# Patient Record
Sex: Female | Born: 1995 | Hispanic: No | Marital: Single | Smoking: Never smoker
Health system: Southern US, Community
[De-identification: ages and names within clinical notes are randomized; demographics above are authoritative.]

## PROBLEM LIST (undated history)

## (undated) HISTORY — PX: TONSILLECTOMY: SUR1361

---
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
## 2013-03-06 ENCOUNTER — Emergency Department (HOSPITAL_BASED_OUTPATIENT_CLINIC_OR_DEPARTMENT_OTHER)
Admission: EM | Admit: 2013-03-06 | Discharge: 2013-03-06 | Disposition: A | Discharge status: Home / Self Care | Payer: BC Managed Care – PPO | Attending: Emergency Medicine | Admitting: Emergency Medicine

## 2013-03-06 ENCOUNTER — Encounter (HOSPITAL_BASED_OUTPATIENT_CLINIC_OR_DEPARTMENT_OTHER): Payer: Self-pay | Admitting: Emergency Medicine

## 2013-03-06 DIAGNOSIS — N390 Urinary tract infection, site not specified: Secondary | ICD-10-CM | POA: Insufficient documentation

## 2013-03-06 DIAGNOSIS — Z3202 Encounter for pregnancy test, result negative: Secondary | ICD-10-CM | POA: Insufficient documentation

## 2013-03-06 DIAGNOSIS — Z79899 Other long term (current) drug therapy: Secondary | ICD-10-CM | POA: Insufficient documentation

## 2013-03-06 DIAGNOSIS — K2970 Gastritis, unspecified, without bleeding: Secondary | ICD-10-CM

## 2013-03-06 LAB — URINE MICROSCOPIC-ADD ON

## 2013-03-06 LAB — URINALYSIS, ROUTINE W REFLEX MICROSCOPIC
Bilirubin Urine: NEGATIVE
Glucose, UA: NEGATIVE mg/dL
Specific Gravity, Urine: 1.024 (ref 1.005–1.030)
Urobilinogen, UA: 1 mg/dL (ref 0.0–1.0)
pH: 6 (ref 5.0–8.0)

## 2013-03-06 LAB — PREGNANCY, URINE: Preg Test, Ur: NEGATIVE

## 2013-03-06 MED ORDER — GI COCKTAIL ~~LOC~~
30.0000 mL | Freq: Once | ORAL | Status: AC
  Administered 2013-03-06: 30 mL via ORAL
  Filled 2013-03-06: qty 30

## 2013-03-06 MED ORDER — OMEPRAZOLE 20 MG PO CPDR: 20.0000 mg | DELAYED_RELEASE_CAPSULE | Freq: Every day | ORAL | Status: DC

## 2013-03-06 MED ORDER — DICYCLOMINE HCL 10 MG/ML IM SOLN
20.0000 mg | Freq: Once | INTRAMUSCULAR | Status: AC
  Administered 2013-03-06: 20 mg via INTRAMUSCULAR
  Filled 2013-03-06: qty 2

## 2013-03-06 MED ORDER — SUCRALFATE 1 GM/10ML PO SUSP: 1.0000 g | Freq: Four times a day (QID) | ORAL | Status: DC

## 2013-03-06 MED ORDER — NITROFURANTOIN MONOHYD MACRO 100 MG PO CAPS: 100.0000 mg | ORAL_CAPSULE | Freq: Two times a day (BID) | ORAL | Status: DC

## 2013-03-06 NOTE — ED Provider Notes (Signed)
CSN: [id number]     Arrival date & time 03/06/13  0600 History   First MD Initiated Contact with Patient 03/06/13 [phone]     Chief Complaint  Patient presents with  . Abdominal Pain   (Consider location/radiation/quality/duration/timing/severity/associated sxs/prior Treatment) Patient is a 17 y.o. female presenting with abdominal pain. The history is provided by the patient.  Abdominal Pain Pain location:  LUQ and epigastric Pain quality: cramping   Pain radiates to:  Does not radiate Pain severity:  Moderate Onset quality:  Sudden Duration:  2 hours Timing:  Constant Progression:  Unchanged Chronicity:  Recurrent Context: eating   Relieved by:  Nothing Worsened by:  Nothing tried Ineffective treatments:  None tried Associated symptoms: no anorexia, no chest pain, no dysuria, no nausea and no shortness of breath   Risk factors: has not had multiple surgeries     History reviewed. No pertinent past medical history. Past Surgical History  Procedure Laterality Date  . Tonsillectomy     History reviewed. No pertinent family history. History  Substance Use Topics  . Smoking status: Never Smoker   . Smokeless tobacco: Not on file  . Alcohol Use: No   OB History   Grav Para Term Preterm Abortions TAB SAB Ect Mult Living                 Review of Systems  Respiratory: Negative for shortness of breath.   Cardiovascular: Negative for chest pain.  Gastrointestinal: Positive for abdominal pain. Negative for nausea and anorexia.  Genitourinary: Negative for dysuria.  All other systems reviewed and are negative.    Allergies  Review of patient's allergies indicates no known allergies.  Home Medications   Current Outpatient Rx  Name  Route  Sig  Dispense  Refill  . omeprazole (PRILOSEC) 20 MG capsule   Oral   Take 1 capsule (20 mg total) by mouth daily.   30 capsule   0   . ondansetron (ZOFRAN ODT) 8 MG disintegrating tablet      8mg  ODT q8 hours prn nausea   4  tablet   0    BP 116/75  Pulse 75  Temp(Src) 98.4 F (36.9 C) (Oral)  Resp 16  Ht 5\' 2"  (1.575 m)  Wt 134 lb (60.782 kg)  BMI 24.5 kg/m2  SpO2 100%  LMP 03/04/2013 Physical Exam  Constitutional: She is oriented to person, place, and time. She appears well-developed and well-nourished. No distress.  Patient sleeping upon entrance to the room  HENT:  Head: Normocephalic and atraumatic.  Mouth/Throat: Oropharynx is clear and moist.  Eyes: Conjunctivae are normal. Pupils are equal, round, and reactive to light.  Neck: Normal range of motion. Neck supple.  Cardiovascular: Normal rate, regular rhythm and intact distal pulses.   Pulmonary/Chest: Effort normal and breath sounds normal. She has no wheezes. She has no rales.  Abdominal: Soft. Bowel sounds are increased. There is no tenderness. There is no rebound and no guarding.  Musculoskeletal: Normal range of motion.  Neurological: She is alert and oriented to person, place, and time.  Skin: Skin is warm and dry.  Psychiatric: She has a normal mood and affect.    ED Course  Procedures (including critical care time) Labs Review Labs Reviewed  URINALYSIS, ROUTINE W REFLEX MICROSCOPIC  PREGNANCY, URINE   Imaging Review No results found.  EKG Interpretation   None       MDM  No diagnosis found. Exam and vitals reassuring consistent with gastritis likely from  nachos.  Will treat.  No indication for labs or imaging at this time  Mallie Linnemann Smitty Cords, MD 03/06/13 [phone]

## 2013-03-06 NOTE — ED Notes (Signed)
C/o upper abd pain that woke her from sleep, pt denies n/v. No fever or diarrhea.

## 2013-03-06 NOTE — ED Notes (Signed)
MD at bedside. 

## 2018-09-27 ENCOUNTER — Other Ambulatory Visit: Payer: Self-pay

## 2018-09-27 ENCOUNTER — Encounter (HOSPITAL_BASED_OUTPATIENT_CLINIC_OR_DEPARTMENT_OTHER): Payer: Self-pay | Admitting: *Deleted

## 2018-09-27 ENCOUNTER — Emergency Department (HOSPITAL_BASED_OUTPATIENT_CLINIC_OR_DEPARTMENT_OTHER)
Admission: EM | Admit: 2018-09-27 | Discharge: 2018-09-27 | Disposition: A | Discharge status: Home / Self Care | Payer: BLUE CROSS/BLUE SHIELD | Attending: Emergency Medicine | Admitting: Emergency Medicine

## 2018-09-27 DIAGNOSIS — Y990 Civilian activity done for income or pay: Secondary | ICD-10-CM | POA: Diagnosis not present

## 2018-09-27 DIAGNOSIS — W228XXA Striking against or struck by other objects, initial encounter: Secondary | ICD-10-CM | POA: Insufficient documentation

## 2018-09-27 DIAGNOSIS — S060X0A Concussion without loss of consciousness, initial encounter: Secondary | ICD-10-CM | POA: Diagnosis not present

## 2018-09-27 DIAGNOSIS — Y9259 Other trade areas as the place of occurrence of the external cause: Secondary | ICD-10-CM | POA: Insufficient documentation

## 2018-09-27 DIAGNOSIS — Y9301 Activity, walking, marching and hiking: Secondary | ICD-10-CM | POA: Diagnosis not present

## 2018-09-27 DIAGNOSIS — S0990XA Unspecified injury of head, initial encounter: Secondary | ICD-10-CM | POA: Diagnosis present

## 2018-09-27 MED ORDER — ONDANSETRON 4 MG PO TBDP: 4.0000 mg | ORAL_TABLET | Freq: Three times a day (TID) | ORAL | 0 refills | Status: DC | PRN

## 2018-09-27 NOTE — ED Triage Notes (Signed)
Pt c/o head injury x 1 day ago c/o h/a

## 2018-09-27 NOTE — ED Notes (Signed)
ED Provider at bedside. 

## 2018-09-27 NOTE — ED Provider Notes (Addendum)
[facility] EMERGENCY DEPARTMENT Provider Note   CSN: [id number] Arrival date & time: 09/27/18  1640    History   Chief Complaint Chief Complaint  Patient presents with  . Head Injury    HPI Heidi Miles is a 23 y.o. female is here for evaluation of head injury while at work last night.  She works at [employer].  She was walking under a metal lift when she unfortunately ran into the bottom part of it.  She had sudden frontal head pain and fell to the ground but did not LOC.  This event was witnessed.  She remembers feeling a little dazed for 2 to 3 minutes afterwards.  She was able to finish the rest of his shift and drove home.  On her drive that night she had to pull over once and had one episode of emesis.  The bright lights were bothering her eyes.  Had associated intermittent blurred vision yesterday, but none today.  Other associated symptoms include nausea, photophobia and mild frontal/forehead scalp pain since the injury.  Has not taken anything for her symptoms.  She denies neck pain, numbness or weakness unilaterally, persistent vomiting, anticoagulant use, seizure.  No recent head injuries.  No history of concussions.     HPI  History reviewed. No pertinent past medical history.  Patient Active Problem List   Diagnosis Date Noted  . WRIST PAIN, LEFT 02/05/2010    Past Surgical History:  Procedure Laterality Date  . TONSILLECTOMY       OB History   No obstetric history on file.      Home Medications    Prior to Admission medications   Medication Sig Start Date End Date Taking? Authorizing Provider  omeprazole (PRILOSEC) 20 MG capsule Take 1 capsule (20 mg total) by mouth daily. 12/22/12   Palumbo, April, MD  omeprazole (PRILOSEC) 20 MG capsule Take 1 capsule (20 mg total) by mouth daily. 03/06/13   Palumbo, April, MD  ondansetron (ZOFRAN ODT) 4 MG disintegrating tablet Take 1 tablet (4 mg total) by mouth every 8 (eight) hours as needed for nausea or  vomiting. 09/27/18   Liberty Handy, PA-C  sucralfate (CARAFATE) 1 GM/10ML suspension Take 10 mLs (1 g total) by mouth 4 (four) times daily. 03/06/13   Palumbo, April, MD    Family History No family history on file.  Social History Social History   Tobacco Use  . Smoking status: Never Smoker  Substance Use Topics  . Alcohol use: No  . Drug use: Yes    Types: Marijuana     Allergies   Patient has no known allergies.   Review of Systems Review of Systems  Eyes: Positive for photophobia and visual disturbance.  Gastrointestinal: Positive for vomiting (x1).  Neurological: Positive for headaches.  All other systems reviewed and are negative.    Physical Exam Updated Vital Signs BP (!) 146/89 (BP Location: Right Arm)   Pulse 75   Temp 98.4 F (36.9 C) (Oral)   Resp 18   Ht 5\' 6"  (1.676 m)   Wt 59 kg   LMP 09/26/2018   SpO2 100%   BMI 20.98 kg/m   Physical Exam Constitutional:      Appearance: She is well-developed. She is not toxic-appearing.  HENT:     Head: Normocephalic.      Comments: Mild tenderness to upper central forehead by hairline, no obvious edema. No overlaying skin abrasions, lacerations, ecchymosis.  Scalp is normal.     Right  Ear: External ear normal.     Left Ear: External ear normal.     Ears:     Comments: No hemotympanum bilaterally. No Battle's sign    Nose: Nose normal.  Eyes:     Conjunctiva/sclera: Conjunctivae normal.     Comments: No racoon's eyes. Able to read my badge at arm's length with each eye and both eyes, no blurred vision.    Neck:     Musculoskeletal: Full passive range of motion without pain.     Comments: c-spine: no midline or paraspinal muscle tenderness  Cardiovascular:     Rate and Rhythm: Normal rate.  Pulmonary:     Effort: Pulmonary effort is normal. No tachypnea or respiratory distress.  Musculoskeletal: Normal range of motion.  Skin:    General: Skin is warm and dry.     Capillary Refill: Capillary  refill takes less than 2 seconds.  Neurological:     Mental Status: She is alert and oriented to person, place, and time.     Comments: Alert and oriented to self, place, time and event.  Speech is fluent without dysarthria or dysphasia. Strength 5/5 in upper/lower extremities intact.   Sensation to light touch intact in face, hands and feet. Normal gait No pronator drift. No leg drop. Normal finger-to-nose.  CN I not tested CN II grossly intact visual fields bilaterally. Unable to visualize posterior eye. CN III, IV, VI PEERL and EOMs intact bilaterally CN V light touch intact in all 3 divisions of trigeminal nerve CN VII facial movements symmetric CN VIII not tested CN IX, X no uvula deviation, symmetric rise of soft palate  CN XI 5/5 SCM and trapezius strength bilaterally  CN XII Midline tongue protrusion, symmetric L/R movements  Psychiatric:        Behavior: Behavior normal.        Thought Content: Thought content normal.      ED Treatments / Results  Labs (all labs ordered are listed, but only abnormal results are displayed) Labs Reviewed - No data to display  EKG None  Radiology No results found.  Procedures Procedures (including critical care time)  Medications Ordered in ED Medications - No data to display   Initial Impression / Assessment and Plan / ED Course  I have reviewed the triage vital signs and the nursing notes.  Pertinent labs & imaging results that were available during my care of the patient were reviewed by me and considered in my medical decision making (see chart for details).       Patient with focal, mild HA, nausea, emesis x 1, photophobia after head trauma.  No LOC.  Low risk MOI.  No other red flag symptoms of head trauma such as diminished GCS, amnesia to event, severe HA, LOC, multiple episodes of vomiting, seizure, evidence to suggest skull fracture, focal neurological deficits on physical exam.  No parietal, occipital, temporal  scalp hematoma. Normal mental status.  No anticoagulant use.  Given benign symptoms, reassuring neurological exam, I do not think CT scan is indicated at this time.  Presentation consistent with mild concussion/TBI vs traumatic headache. Concussion/mild TBI anticipatory guidance discussed with and given to patient.  Also discussed post concussive syndrome, symptoms that would warrant return to the emergency department for re-evaluation.  Information pertaining to diagnosis given to patient.   Recommended 48-72 hr cognitive and physical rest period, and slow return to work if symptoms have resolved.  F/u with PCP/concussion clinic in 48 hours for re-evaluation if symptoms  are lingering.  Patient verbalized understanding and comfortable with plan.   Final Clinical Impressions(s) / ED Diagnoses   Final diagnoses:  Concussion without loss of consciousness, initial encounter    ED Discharge Orders         Ordered    ondansetron (ZOFRAN ODT) 4 MG disintegrating tablet  Every 8 hours PRN     09/27/18 1732             Liberty HandyGibbons, Lacharles Altschuler J, PA-C 09/27/18 1756    Arby BarrettePfeiffer, Marcy, MD 10/11/18 [phone]

## 2018-09-27 NOTE — Discharge Instructions (Addendum)
Your symptoms are most likely due to a mild concussion. Read attached information on concussion.   Concussion symptoms include include headache, nausea, sleep disturbance, dizziness, short term memory loss, vision disturbances, nausea, vomiting, light sensitivity.  Symptom may persist and slowly improve in 2-3 weeks, usually they resolve sooner in less than 1 week. These symptoms can wax and wane, and can be worsened by physical or cognitive exertion like reading, watching TV, studying, using computer, playing video games.   Take acetaminophen or ibuprofen for associated pain. Ice your scalp. Ondansetron 4 mg for nausea, as needed.    We recommend physical and cognitive rest for the next 48-72 hours. This means avoid any heavy exertional or cognitive (brain) activity during this time, that may worsen your symptoms, including reading, computer use, playing video games, watching TV, exercising, jumping, playing sports, high stress work environments.  You may return to work slowly after 48-72 hour rest period if your symptoms have resolved.  If your symptoms have not resolved after this 48-72 hour rest period, you will need to follow up with your primary care doctor or concussion or sports specialist for re-evaluation and to determine if you can return to work or if you need more tests or longer rest period.  The most important part of concussion management is avoiding a repeat head injury, do not perform any activities that will put you at risk for a second head injury. If you are an athlete, you must be cleared by a clinician before fully returning to sports.   Return to the emergency department if you develop mental status change, lethargy, vomiting, vision changes, confusion, severe or worsening headaches, seizures, weakness or numbness involving any part of your body.

## 2018-09-28 ENCOUNTER — Emergency Department (HOSPITAL_COMMUNITY)
Admission: EM | Admit: 2018-09-28 | Discharge: 2018-09-28 | Disposition: A | Discharge status: Home / Self Care | Payer: No Typology Code available for payment source | Attending: Emergency Medicine | Admitting: Emergency Medicine

## 2018-09-28 ENCOUNTER — Other Ambulatory Visit: Payer: Self-pay

## 2018-09-28 ENCOUNTER — Encounter (HOSPITAL_COMMUNITY): Payer: Self-pay | Admitting: Emergency Medicine

## 2018-09-28 ENCOUNTER — Emergency Department (HOSPITAL_COMMUNITY): Payer: No Typology Code available for payment source

## 2018-09-28 DIAGNOSIS — W228XXD Striking against or struck by other objects, subsequent encounter: Secondary | ICD-10-CM | POA: Insufficient documentation

## 2018-09-28 DIAGNOSIS — R42 Dizziness and giddiness: Secondary | ICD-10-CM | POA: Diagnosis not present

## 2018-09-28 DIAGNOSIS — R1110 Vomiting, unspecified: Secondary | ICD-10-CM | POA: Insufficient documentation

## 2018-09-28 DIAGNOSIS — F1210 Cannabis abuse, uncomplicated: Secondary | ICD-10-CM | POA: Insufficient documentation

## 2018-09-28 DIAGNOSIS — R51 Headache: Secondary | ICD-10-CM | POA: Diagnosis not present

## 2018-09-28 DIAGNOSIS — S0990XD Unspecified injury of head, subsequent encounter: Secondary | ICD-10-CM | POA: Insufficient documentation

## 2018-09-28 DIAGNOSIS — M542 Cervicalgia: Secondary | ICD-10-CM | POA: Diagnosis not present

## 2018-09-28 MED ORDER — ONDANSETRON 4 MG PO TBDP
4.0000 mg | ORAL_TABLET | Freq: Once | ORAL | Status: AC
  Administered 2018-09-28: 4 mg via ORAL
  Filled 2018-09-28: qty 1

## 2018-09-28 MED ORDER — ACETAMINOPHEN 325 MG PO TABS
650.0000 mg | ORAL_TABLET | Freq: Once | ORAL | Status: AC
  Administered 2018-09-28: 11:00:00 650 mg via ORAL
  Filled 2018-09-28: qty 2

## 2018-09-28 NOTE — Discharge Instructions (Signed)
You were seen in the emergency department today following a head injury.  We suspect that you have a concussion, otherwise known and as a mild traumatic brain injury.  Your CT scan did not show any new abnormality such as a brain bleed.  We would like you to follow-up with the [facility], contact information below:  Address: [address] [place], [place] [address] Phone: [phone]  Per [facility] Website:   What to Expect: Evaluations at the Concussion Clinic All patients at the Concussion Clinic are given an extensive three-part evaluation that includes: a computerized test to measure memory, visual processing speed, and reaction time a test that measures the systems that integrate movement, balance, and vision an in-depth review of a detailed symptoms checklist for signs of concussion The evaluation process is critical for the treatment and recovery of a concussed patient as no two concussions are alike. Thankfully, the diagnostic tools that trained professionals use can help to better manage head injuries.  Part of the technology the doctors and staff at [facility] use in their assessments is a computerized examination called ImPACT. This tool uses six tasks to measure memory, visual processing speed, and reaction time. By analyzing the results of the examination and comparing them to average responses or a baseline score for a patient, our staff can make an informed judgment about the patients cognitive functions. In addition to the ImPACT examination, patients are given a Vestibular Ocular Motor Screening test. This is a simple and painless test that focuses on the systems that integrate a patients movement, balance, and vision.  These tests are used in conjunction with a thorough review of a detailed symptoms checklist to complete the patients evaluation and develop a treatment plan.  You do not need a referral,  and you can book an appointment online. Our Concussion Hotline is staffed by trained professionals during our regular office hours: Monday - Thursday from 7:30 AM to 4:30 PM, and Fridays from 7:30 AM to 12:00 PM, and the number to call is [phone]. The Concussion Clinic team meets with patients at our [facility] office. Call today.   Further ED Instructions:   Please call and follow-up within the concussion clinic as well as your primary care provider within the next 3 to 5 days.  In the meantime we would like you to avoid strenuous/over exertional activities such as sports or running.  Please avoid excess screen time utilizing cell phones, computers, or the TV.  Please avoid activities that require significant amount of concentration.  Please try to rest as much as possible.  Please take Tylenol and/or Motrin per over-the-counter dosing instructions for any continued discomfort.  Take zofran as prescribed at prior visit for nausea/vomiting. Return to the ER for new or worsening symptoms or any other concerns that you may have.

## 2018-09-28 NOTE — ED Notes (Signed)
Patient verbalizes understanding of discharge instructions. Opportunity for questioning and answers were provided. Armband removed by staff, pt discharged from ED.  

## 2018-09-28 NOTE — ED Provider Notes (Signed)
[facility] Provider Note   CSN: [id number] Arrival date & time: 09/28/18  1048    History   Chief Complaint Chief Complaint  Patient presents with  . Head Injury  . Emesis    HPI Heidi Miles is a 23 y.o. female returns to the ER from employee health for repeat evaluation s/p head injury 2 days prior. Patient works at [employer], she was crouching/walking under a metal lift when she ran into the bottom portion of it with resultant fairly quick onset frontal headache. She did fall to the ground, but does not believe she had any LOC. She states she does not fully remember as she felt dazed. Ambulatory following. Had an episodes of emesis on her drive home. Seen in ER next day, thought to have concussion/mild TBI, discharged home w/ zofran. States she has had continued headache w/ some neck pain, feels a bit dizzy with position changes, and this AM woke up with 2 episodes of emesis PTA.  Did not take any of the Zofran previously prescribed as she wasn't sure if she could take it on an empty stomach. Current pain 5/10 in severity, tylenol has helped some, a bit worse with bright lights.  Seen at employee health and was instructed to come to the emergency department for CT imaging of her head (despite triage note mention of MRI- patient states CT to me).  He had some blurry vision initially but this is resolved.  Denies numbness, tingling, weakness, or seizure activity.  Patient is currently menstruating, denies chance of pregnancy.     HPI  History reviewed. No pertinent past medical history.  Patient Active Problem List   Diagnosis Date Noted  . WRIST PAIN, LEFT 02/05/2010    Past Surgical History:  Procedure Laterality Date  . TONSILLECTOMY       OB History   No obstetric history on file.      Home Medications    Prior to Admission medications   Medication Sig Start Date End Date Taking? Authorizing Provider  omeprazole (PRILOSEC) 20 MG  capsule Take 1 capsule (20 mg total) by mouth daily. 12/22/12   Palumbo, April, MD  omeprazole (PRILOSEC) 20 MG capsule Take 1 capsule (20 mg total) by mouth daily. 03/06/13   Palumbo, April, MD  ondansetron (ZOFRAN ODT) 4 MG disintegrating tablet Take 1 tablet (4 mg total) by mouth every 8 (eight) hours as needed for nausea or vomiting. 09/27/18   Liberty HandyGibbons, Claudia J, PA-C  sucralfate (CARAFATE) 1 GM/10ML suspension Take 10 mLs (1 g total) by mouth 4 (four) times daily. 03/06/13   Palumbo, April, MD    Family History No family history on file.  Social History Social History   Tobacco Use  . Smoking status: Never Smoker  . Smokeless tobacco: Never Used  Substance Use Topics  . Alcohol use: No  . Drug use: Yes    Types: Marijuana     Allergies   Patient has no known allergies.   Review of Systems Review of Systems  Constitutional: Negative for chills and fever.  Eyes: Positive for visual disturbance (resolved).  Respiratory: Negative for shortness of breath.   Cardiovascular: Negative for chest pain.  Gastrointestinal: Positive for nausea and vomiting. Negative for abdominal pain, blood in stool, constipation and diarrhea.  Genitourinary: Positive for vaginal bleeding (currently menstruating). Negative for dysuria.  Musculoskeletal: Positive for neck pain.  Neurological: Positive for dizziness and headaches. Negative for seizures, speech difficulty, weakness and numbness.  All  other systems reviewed and are negative.    Physical Exam Updated Vital Signs BP 130/85 (BP Location: Right Arm)   Pulse 68   Temp 98 F (36.7 C) (Oral)   Resp 16   Wt 59 kg   LMP 09/26/2018   SpO2 100%   BMI 20.99 kg/m   Physical Exam Vitals signs and nursing note reviewed.  Constitutional:      General: She is not in acute distress.    Appearance: She is well-developed.  HENT:     Head: Normocephalic and atraumatic. No raccoon eyes or Battle's sign.     Right Ear: No hemotympanum.      Left Ear: No hemotympanum.  Eyes:     General:        Right eye: No discharge.        Left eye: No discharge.     Conjunctiva/sclera: Conjunctivae normal.     Pupils: Pupils are equal, round, and reactive to light.  Neck:     Musculoskeletal: Normal range of motion. Spinous process tenderness (diffuse midline cervical spine without point/focal tenderness) and muscular tenderness present.  Cardiovascular:     Rate and Rhythm: Normal rate and regular rhythm.     Heart sounds: No murmur.  Pulmonary:     Effort: No respiratory distress.     Breath sounds: Normal breath sounds. No wheezing or rales.  Abdominal:     General: There is no distension.     Palpations: Abdomen is soft.     Tenderness: There is no abdominal tenderness. There is no guarding or rebound.  Skin:    General: Skin is warm and dry.     Findings: No rash.  Neurological:     Comments: Alert. Clear speech. No facial droop. CNIII-XII grossly intact. Bilateral upper and lower extremities' sensation grossly intact. 5/5 symmetric strength with grip strength and with plantar and dorsi flexion bilaterally. Normal finger to nose bilaterally. Negative pronator drift. Negative Romberg sign. Gait is steady and intact.   Psychiatric:        Behavior: Behavior normal.      ED Treatments / Results  Labs (all labs ordered are listed, but only abnormal results are displayed) Labs Reviewed - No data to display  EKG None  Radiology Ct Head Wo Contrast  Result Date: 09/28/2018 CLINICAL DATA:  Ran into a metal pole at work 2 days ago. Persistent nausea and vomiting since the injury with frontal headache and posterior neck pain. EXAM: CT HEAD WITHOUT CONTRAST CT CERVICAL SPINE WITHOUT CONTRAST TECHNIQUE: Multidetector CT imaging of the head and cervical spine was performed following the standard protocol without intravenous contrast. Multiplanar CT image reconstructions of the cervical spine were also generated. COMPARISON:  None.  FINDINGS: CT HEAD FINDINGS Brain: No evidence of acute infarction, hemorrhage, hydrocephalus, extra-axial collection or mass lesion/mass effect. Vascular: No hyperdense vessel or unexpected calcification. Skull: Normal. Negative for fracture or focal lesion. Sinuses/Orbits: No acute finding. Other: None. CT CERVICAL SPINE FINDINGS Alignment: Reversal of the normal cervical lordosis. Otherwise normal alignment. Skull base and vertebrae: No acute fracture. No primary bone lesion or focal pathologic process. Soft tissues and spinal canal: No prevertebral fluid or swelling. No visible canal hematoma. Disc levels:  Normal. Upper chest: Negative. Other: None. IMPRESSION: 1.  No acute intracranial abnormality. 2.  No acute cervical spine fracture. Electronically Signed   By: Obie Dredge M.D.   On: 09/28/2018 12:26   Ct Cervical Spine Wo Contrast  Result Date: 09/28/2018 CLINICAL  DATA:  Ran into a metal pole at work 2 days ago. Persistent nausea and vomiting since the injury with frontal headache and posterior neck pain. EXAM: CT HEAD WITHOUT CONTRAST CT CERVICAL SPINE WITHOUT CONTRAST TECHNIQUE: Multidetector CT imaging of the head and cervical spine was performed following the standard protocol without intravenous contrast. Multiplanar CT image reconstructions of the cervical spine were also generated. COMPARISON:  None. FINDINGS: CT HEAD FINDINGS Brain: No evidence of acute infarction, hemorrhage, hydrocephalus, extra-axial collection or mass lesion/mass effect. Vascular: No hyperdense vessel or unexpected calcification. Skull: Normal. Negative for fracture or focal lesion. Sinuses/Orbits: No acute finding. Other: None. CT CERVICAL SPINE FINDINGS Alignment: Reversal of the normal cervical lordosis. Otherwise normal alignment. Skull base and vertebrae: No acute fracture. No primary bone lesion or focal pathologic process. Soft tissues and spinal canal: No prevertebral fluid or swelling. No visible canal hematoma.  Disc levels:  Normal. Upper chest: Negative. Other: None. IMPRESSION: 1.  No acute intracranial abnormality. 2.  No acute cervical spine fracture. Electronically Signed   By: Obie Dredge M.D.   On: 09/28/2018 12:26    Procedures Procedures (including critical care time)  Medications Ordered in ED Medications - No data to display   Initial Impression / Assessment and Plan / ED Course  I have reviewed the triage vital signs and the nursing notes.  Pertinent labs & imaging results that were available during my care of the patient were reviewed by me and considered in my medical decision making (see chart for details).   Patient presents to the emergency department status post head injury 2 days prior with continued headache, intermittent dizziness, as well as nausea with 3 episodes of emesis since the injury.  Patient nontoxic-appearing, no apparent distress, vitals WNL.  She was sent to the emergency department from employee health for imaging of her head.  She has a fairly benign physical exam.  She has some mild diffuse cervical tenderness without point/focal vertebral tenderness or palpable step-off.  Neurologic exam without focal deficits. Abdomen is nontender without peritoneal signs- do not suspect acute surgical abdomen, vomiting likely secondary to head injury. Initial plan for urine pregnancy testing, patient unable to provide sample, on her menstrual cycle and denies chance of pregnancy therefore this was discontinued.  CT head/Cspine negative for acute abnormality. No head bleed. Likely concussion/mild TBI as discussed @ prior ER visit. Tolerating PO here without vomiting. Discharge home with concussion clinic follow up. Has prior prescription for zofran. Tylenol/motrin recommended for pain. I discussed results, treatment plan, need for follow-up, and return precautions with the patient. Provided opportunity for questions, patient confirmed understanding and is in agreement with plan.    Final Clinical Impressions(s) / ED Diagnoses   Final diagnoses:  Injury of head, subsequent encounter    ED Discharge Orders    None       Cherly Anderson, PA-C 09/28/18 1252    Margarita Grizzle, MD 09/28/18 [phone]

## 2018-09-28 NOTE — ED Triage Notes (Signed)
Pt in with c/o mild concussion sustained 48hrs ago at work. Has had persistent n/v since. Workers comp sent her to ED for MRI and eval

## 2019-12-15 IMAGING — CT CT CERVICAL SPINE WITHOUT CONTRAST
3 of 4 series · 13 of 33 positions shown, 16 images · non-contrast
Comparison: None.

CLINICAL DATA: Ran into a metal pole at work 2 days ago. Persistent
nausea and vomiting since the injury with frontal headache and
posterior neck pain.

EXAM:
CT HEAD WITHOUT CONTRAST
CT CERVICAL SPINE WITHOUT CONTRAST
TECHNIQUE: Multidetector CT imaging of the head and cervical spine was
performed following the standard protocol without intravenous
contrast. Multiplanar CT image reconstructions of the cervical spine
were also generated.

[Series 5: c_spine 2.0 st · axial · 0.28mm/px · z∈[-176,-44]mm · 5 of 100 slices shown, 7 images]
[im 17/100  soft-tissue]
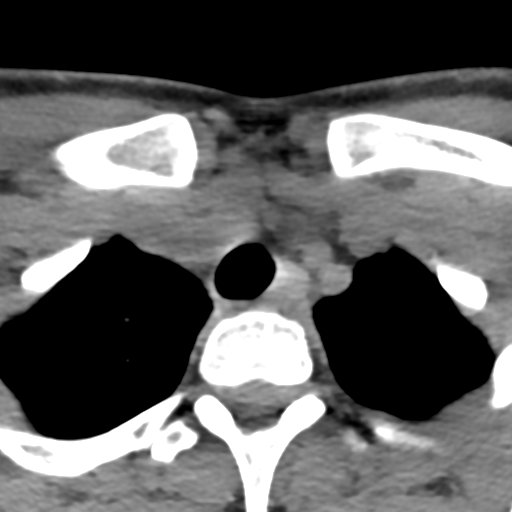
[im 17/100  bone]
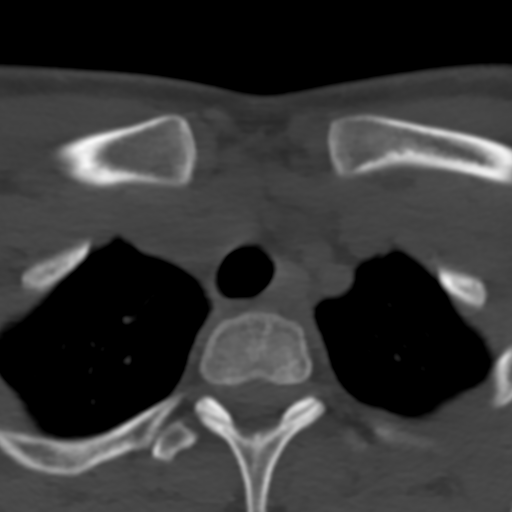
[im 34/100  bone]
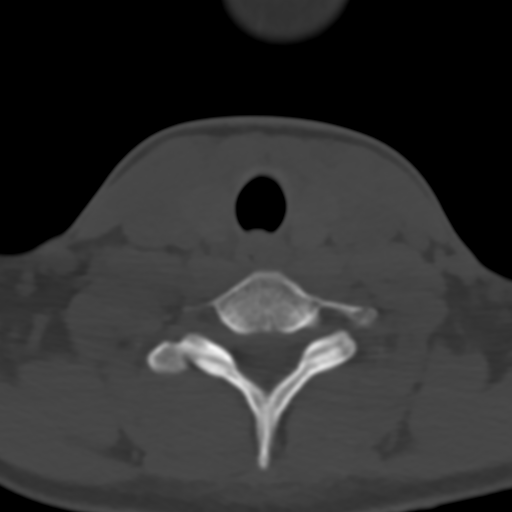
[im 50/100  bone]
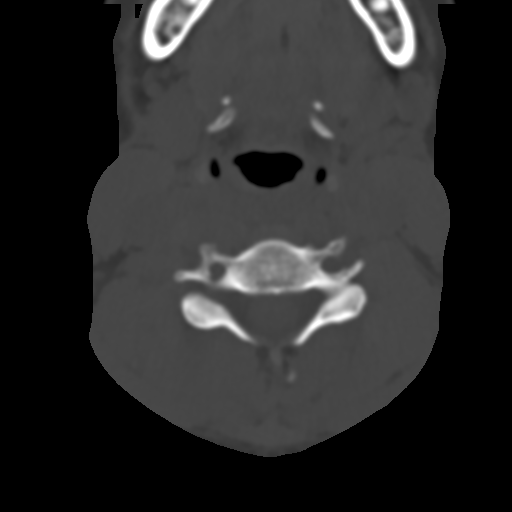
[im 67/100  bone]
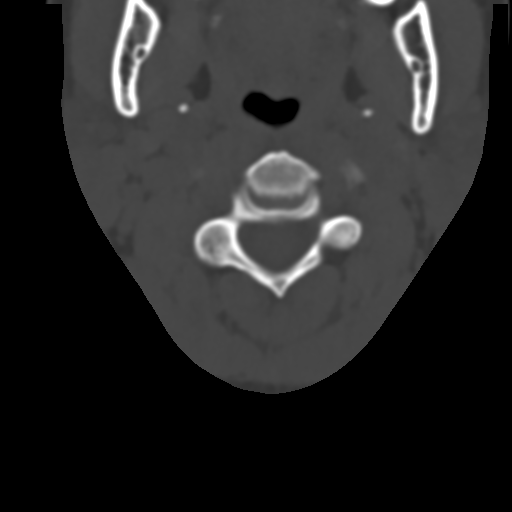
[im 83/100  soft-tissue]
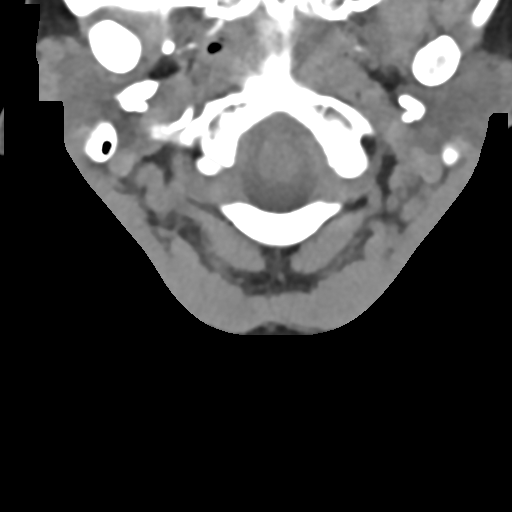
[im 83/100  bone]
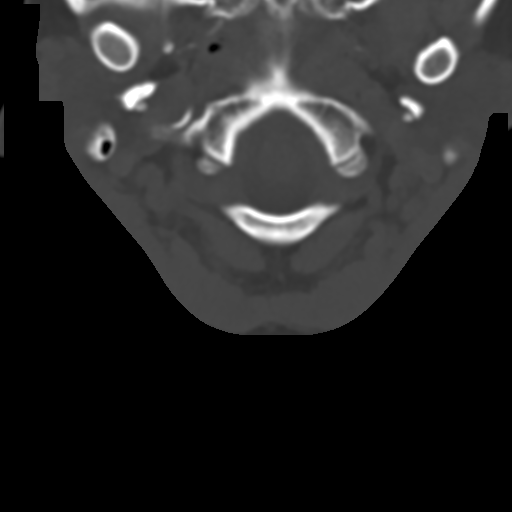

[Series 8: sagittal bone · sagittal · 0.42mm/px · 5 of 61 slices shown, 6 images]
[im 21/61  bone]
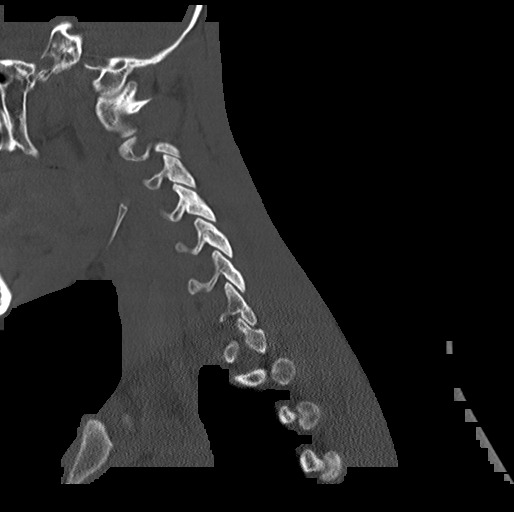
[im 26/61  bone]
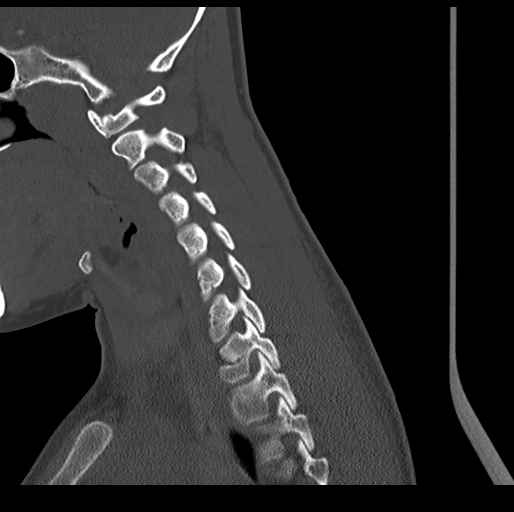
[im 31/61  soft-tissue]
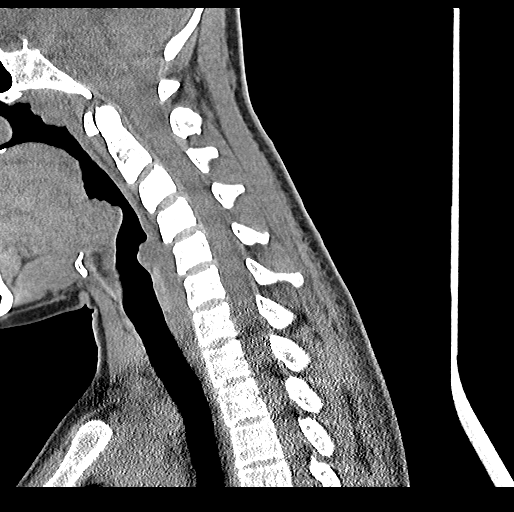
[im 31/61  bone]
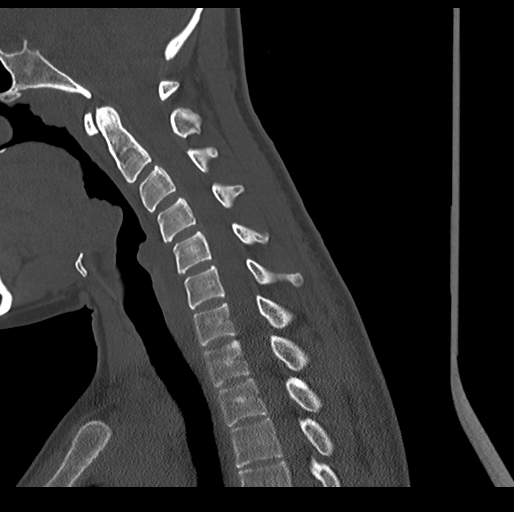
[im 36/61  bone]
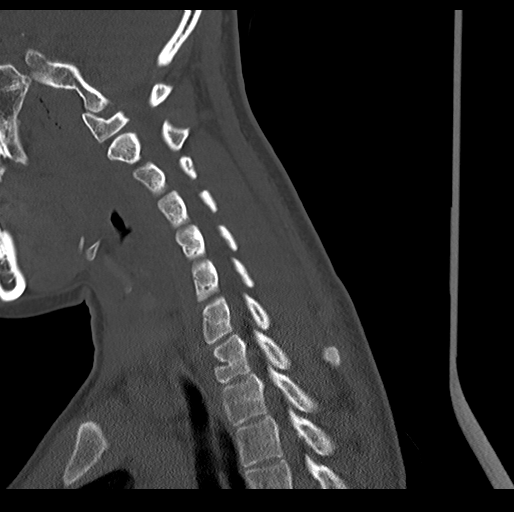
[im 41/61  bone]
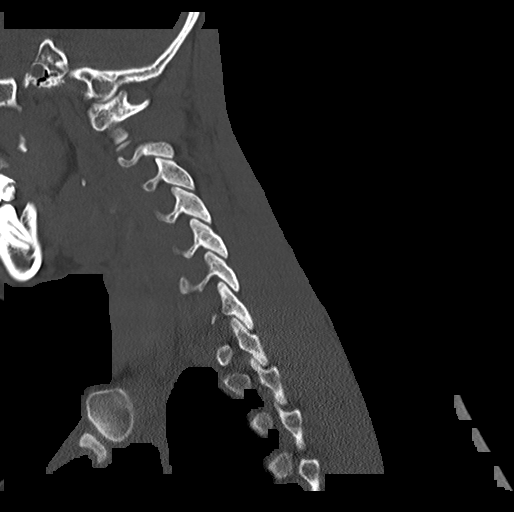

[Series 9: coronal bone · coronal · 0.28mm/px · 3 of 98 slices shown]
[im 29/98  bone]
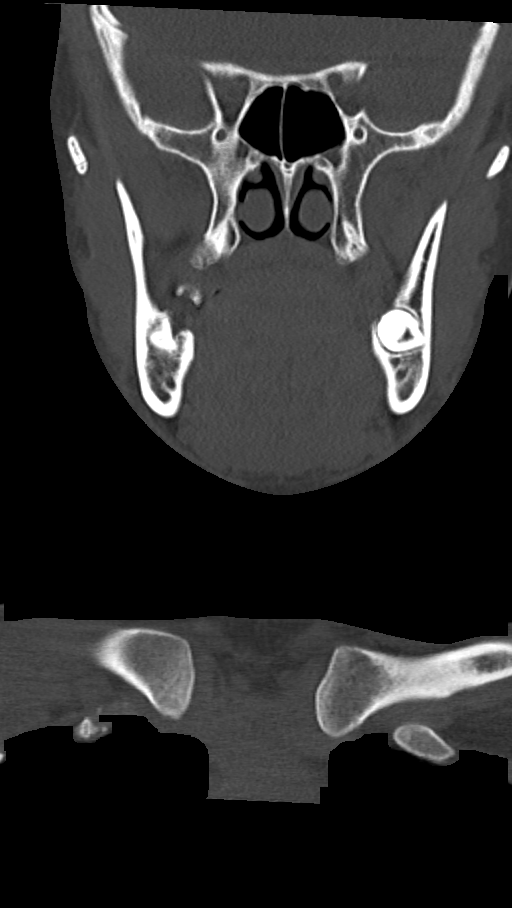
[im 42/98  bone]
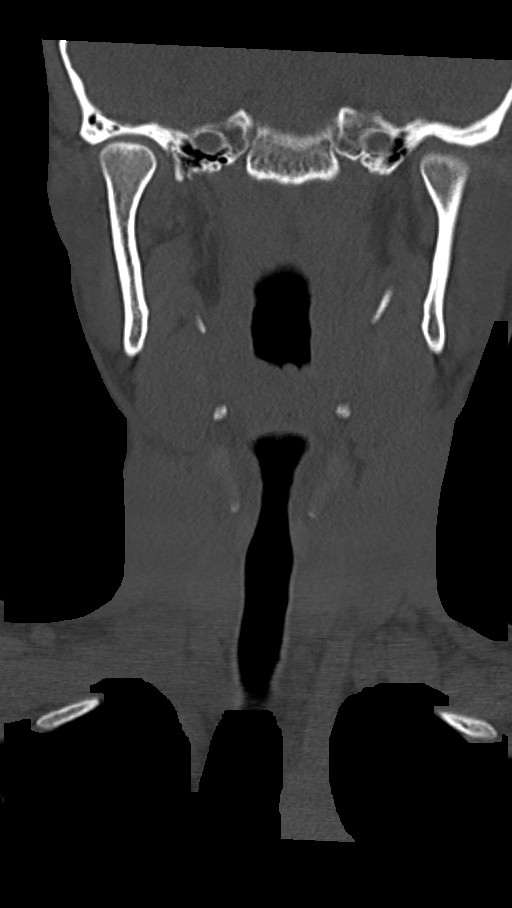
[im 56/98  bone]
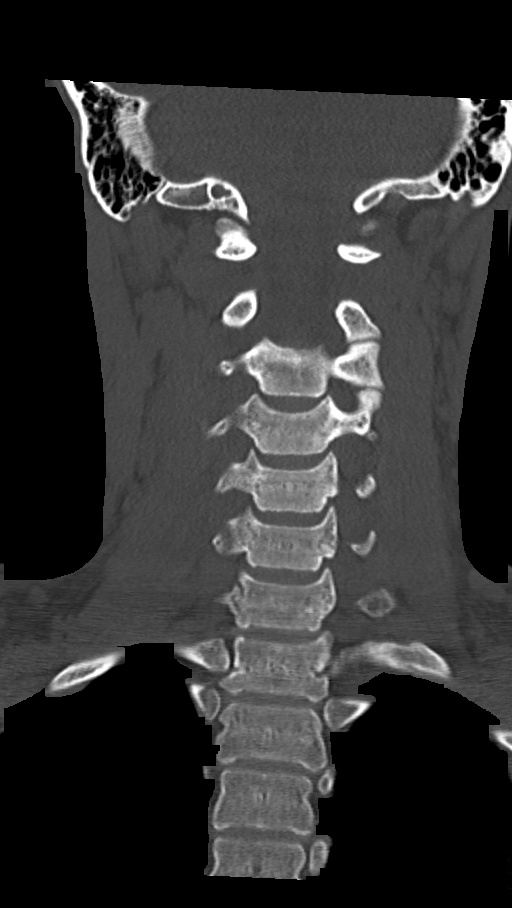

[13 of 33 positions shown; findings below may reference images not displayed]

FINDINGS: CT HEAD FINDINGS

Brain: No evidence of acute infarction, hemorrhage, hydrocephalus,
extra-axial collection or mass lesion/mass effect.

Vascular: No hyperdense vessel or unexpected calcification.

Skull: Normal. Negative for fracture or focal lesion.

Sinuses/Orbits: No acute finding.

Other: None.

CT CERVICAL SPINE FINDINGS

Alignment: Reversal of the normal cervical lordosis. Otherwise
normal alignment.

Skull base and vertebrae: No acute fracture. No primary bone lesion
or focal pathologic process.

Soft tissues and spinal canal: No prevertebral fluid or swelling. No
visible canal hematoma.

Disc levels:  Normal.

Upper chest: Negative.

Other: None.
IMPRESSION: 1.  No acute intracranial abnormality.
2.  No acute cervical spine fracture.

## 2020-05-18 ENCOUNTER — Other Ambulatory Visit: Payer: Self-pay

## 2020-05-18 ENCOUNTER — Emergency Department (HOSPITAL_BASED_OUTPATIENT_CLINIC_OR_DEPARTMENT_OTHER)
Admission: EM | Admit: 2020-05-18 | Discharge: 2020-05-18 | Disposition: A | Discharge status: Home / Self Care | Payer: BC Managed Care – PPO | Attending: Emergency Medicine | Admitting: Emergency Medicine

## 2020-05-18 ENCOUNTER — Encounter (HOSPITAL_BASED_OUTPATIENT_CLINIC_OR_DEPARTMENT_OTHER): Payer: Self-pay | Admitting: Emergency Medicine

## 2020-05-18 DIAGNOSIS — K0889 Other specified disorders of teeth and supporting structures: Secondary | ICD-10-CM | POA: Insufficient documentation

## 2020-05-18 DIAGNOSIS — Z5321 Procedure and treatment not carried out due to patient leaving prior to being seen by health care provider: Secondary | ICD-10-CM | POA: Diagnosis not present

## 2020-05-18 DIAGNOSIS — M791 Myalgia, unspecified site: Secondary | ICD-10-CM | POA: Insufficient documentation

## 2020-05-18 NOTE — ED Provider Notes (Signed)
Patient left without being seen. I didn't establish care with patient    Charlynne Pander, MD 05/18/20 1536

## 2020-05-18 NOTE — ED Notes (Signed)
Called no answer

## 2020-05-18 NOTE — ED Triage Notes (Signed)
Pt c/o possible wisdom tooth infection onset last Tuesday and body aches onset Saturday.

## 2021-10-15 ENCOUNTER — Ambulatory Visit
Admission: RE | Admit: 2021-10-15 | Discharge: 2021-10-15 | Disposition: A | Discharge status: Home / Self Care | Payer: BC Managed Care – PPO | Source: Ambulatory Visit | Attending: Emergency Medicine | Admitting: Emergency Medicine

## 2021-10-15 ENCOUNTER — Other Ambulatory Visit: Payer: Self-pay

## 2021-10-15 VITALS — BP 111/76 | HR 76 | Resp 18

## 2021-10-15 DIAGNOSIS — J029 Acute pharyngitis, unspecified: Secondary | ICD-10-CM | POA: Diagnosis not present

## 2021-10-15 DIAGNOSIS — J988 Other specified respiratory disorders: Secondary | ICD-10-CM | POA: Insufficient documentation

## 2021-10-15 LAB — POCT RAPID STREP A (OFFICE): Rapid Strep A Screen: NEGATIVE

## 2021-10-15 MED ORDER — METHYLPREDNISOLONE 4 MG PO TBPK: ORAL_TABLET | ORAL | 0 refills | Status: DC

## 2021-10-15 MED ORDER — AZITHROMYCIN 250 MG PO TABS: ORAL_TABLET | ORAL | 0 refills | Status: AC

## 2021-10-15 MED ORDER — FLUOCINOLONE ACETONIDE 0.01 % OT OIL: 5.0000 [drp] | TOPICAL_OIL | Freq: Two times a day (BID) | OTIC | 0 refills | Status: AC | PRN

## 2021-10-15 NOTE — ED Provider Notes (Signed)
[facility]    CSN: [id number] Arrival date & time: 10/15/21  [id number]    HISTORY   Chief Complaint  Patient presents with   Sore Throat   HPI Heidi Miles is a 26 y.o. female. Patient presents to [facility] for evaluation of sore throat starting last week, had fever Wednesday through Friday, feltbetter, went to a wedding Sunday, began to have scratchy throat Monday morning, and has been worsening ever since.  She states she feels like she is having trouble swallowing, like she couldn't swallow gel pills the other day when she normally doesn't have any issue.  Patient denies headache, body ache, fever, chills, cough, nasal congestion, upset stomach.  The history is provided by the patient.  History reviewed. No pertinent past medical history. Patient Active Problem List   Diagnosis Date Noted   WRIST PAIN, LEFT 02/05/2010   Past Surgical History:  Procedure Laterality Date   TONSILLECTOMY     OB History   No obstetric history on file.    Home Medications    Prior to Admission medications   Medication Sig Start Date End Date Taking? Authorizing Provider  [place] 28 0.25-35 MG-MCG tablet Take 1 tablet by mouth daily. 07/31/18   [provider]   Family History Family History  Problem Relation Age of Onset   Healthy Mother    Healthy Father    Social History Social History   Tobacco Use   Smoking status: Never   Smokeless tobacco: Never  Vaping Use   Vaping Use: Never used  Substance Use Topics   Alcohol use: No   Drug use: Yes    Types: Marijuana   Allergies   Patient has no known allergies.  Review of Systems Review of Systems Pertinent findings noted in history of present illness.   Physical Exam Triage Vital Signs ED Triage Vitals  Enc Vitals Group     BP 03/13/21 0827 (!) 147/82     Pulse Rate 03/13/21 0827 72     Resp 03/13/21 0827 18     Temp 03/13/21 0827 98.3 F (36.8 C)     Temp Source 03/13/21 0827 Oral     SpO2 03/13/21 0827  98 %     Weight --      Height --      Head Circumference --      Peak Flow --      Pain Score 03/13/21 0826 5     Pain Loc --      Pain Edu? --      Excl. in [place]? --   No data found.  Updated Vital Signs BP 111/76 (BP Location: Right Arm)   Pulse 76   Resp 18   LMP 09/23/2021   SpO2 97%   Physical Exam Constitutional:      General: She is not in acute distress.    Appearance: She is well-developed. She is ill-appearing. She is not toxic-appearing.  HENT:     Head: Normocephalic and atraumatic.     Salivary Glands: Right salivary gland is diffusely enlarged and tender. Left salivary gland is diffusely enlarged and tender.     Right Ear: Hearing and external ear normal.     Left Ear: Hearing and external ear normal.     Ears:     Comments: Bilateral EACs with mild erythema, left EAC slightly macerated without drainage, bilateral TMs are normal    Nose: Rhinorrhea present. No mucosal edema or congestion.     Right Turbinates:  Not enlarged, swollen or pale.     Left Turbinates: Not enlarged or swollen.     Right Sinus: No maxillary sinus tenderness or frontal sinus tenderness.     Left Sinus: No maxillary sinus tenderness or frontal sinus tenderness.     Mouth/Throat:     Lips: Pink. No lesions.     Mouth: Mucous membranes are moist. No oral lesions or angioedema.     Dentition: No gingival swelling.     Tongue: No lesions.     Palate: No mass.     Pharynx: Uvula midline. Pharyngeal swelling, oropharyngeal exudate, posterior oropharyngeal erythema and uvula swelling present.     Comments: Tonsils absent Eyes:     Extraocular Movements: Extraocular movements intact.     Conjunctiva/sclera: Conjunctivae normal.     Pupils: Pupils are equal, round, and reactive to light.  Neck:     Thyroid: No thyroid mass, thyromegaly or thyroid tenderness.     Trachea: Tracheal tenderness present. No abnormal tracheal secretions or tracheal deviation.     Comments: Voice is  muffled Cardiovascular:     Rate and Rhythm: Normal rate and regular rhythm.     Pulses: Normal pulses.     Heart sounds: Normal heart sounds, S1 normal and S2 normal. No murmur heard.   No friction rub. No gallop.  Pulmonary:     Effort: Pulmonary effort is normal. No accessory muscle usage, prolonged expiration, respiratory distress or retractions.     Breath sounds: No stridor, decreased air movement or transmitted upper airway sounds. No decreased breath sounds, wheezing, rhonchi or rales.  Abdominal:     General: Bowel sounds are normal.     Palpations: Abdomen is soft.     Tenderness: There is generalized abdominal tenderness. There is no right CVA tenderness, left CVA tenderness or rebound. Negative signs include Murphy's sign.     Hernia: No hernia is present.  Musculoskeletal:        General: No tenderness. Normal range of motion.     Cervical back: Full passive range of motion without pain, normal range of motion and neck supple.     Right lower leg: No edema.     Left lower leg: No edema.  Lymphadenopathy:     Cervical: Cervical adenopathy present.     Right cervical: Superficial cervical adenopathy present.     Left cervical: Superficial cervical adenopathy present.  Skin:    General: Skin is warm and dry.     Findings: No erythema, lesion or rash.  Neurological:     General: No focal deficit present.     Mental Status: She is alert and oriented to person, place, and time. Mental status is at baseline.  Psychiatric:        Mood and Affect: Mood normal.        Behavior: Behavior normal.        Thought Content: Thought content normal.        Judgment: Judgment normal.    Visual Acuity Right Eye Distance:   Left Eye Distance:   Bilateral Distance:    Right Eye Near:   Left Eye Near:    Bilateral Near:     UC Couse / Diagnostics / Procedures:    EKG  Radiology No results found.  Procedures Procedures (including critical care time)  UC Diagnoses / Final  Clinical Impressions(s)   I have reviewed the triage vital signs and the nursing notes.  Pertinent labs & imaging results that were available  during my care of the patient were reviewed by me and considered in my medical decision making (see chart for details).   Final diagnoses:  Sore throat  Respiratory infection   Rapid strep test today is negative, throat culture pending.  Patient advised I recommend she begin tapering dose of methylprednisolone for the throat pain and difficulty swallowing.  Because physical exam findings are more concerning for bacterial infection and therefore viral infection in her throat, have also provided her with a prescription for azithromycin to treat her empirically for bacterial infection in her throat.  Return precautions advised.  ED Prescriptions     Medication Sig Dispense Auth. Provider   azithromycin (ZITHROMAX) 250 MG tablet Take 2 tablets (500 mg total) by mouth daily for 1 day, THEN 1 tablet (250 mg total) daily for 4 days. 6 tablet Lynden Oxford Scales, PA-C   methylPREDNISolone (MEDROL DOSEPAK) 4 MG TBPK tablet Take 24 mg on day 1, 20 mg on day 2, 16 mg on day 3, 12 mg on day 4, 8 mg on day 5, 4 mg on day 6.  Take all tablets in each row at once, do not spread tablets out throughout the day. 21 tablet Lynden Oxford Scales, PA-C   Fluocinolone Acetonide (DERMOTIC) 0.01 % OIL Place 5 drops in ear(s) 2 (two) times daily as needed (Itching in ears). 20 mL Lynden Oxford Scales, PA-C      PDMP not reviewed this encounter.  Pending results:  Labs Reviewed  CULTURE, GROUP A STREP [facility])  POCT RAPID STREP A (OFFICE)    Medications Ordered in UC: Medications - No data to display  Disposition Upon Discharge:  Condition: stable for discharge home Home: take medications as prescribed; routine discharge instructions as discussed; follow up as advised.  Patient presented with an acute illness with associated systemic symptoms and significant  discomfort requiring urgent management. In my opinion, this is a condition that a prudent lay person (someone who possesses an average knowledge of health and medicine) may potentially expect to result in complications if not addressed urgently such as respiratory distress, impairment of bodily function or dysfunction of bodily organs.   Routine symptom specific, illness specific and/or disease specific instructions were discussed with the patient and/or caregiver at length.   As such, the patient has been evaluated and assessed, work-up was performed and treatment was provided in alignment with urgent care protocols and evidence based medicine.  Patient/parent/caregiver has been advised that the patient may require follow up for further testing and treatment if the symptoms continue in spite of treatment, as clinically indicated and appropriate.  If the patient was tested for COVID-19, Influenza and/or RSV, then the patient/parent/guardian was advised to isolate at home pending the results of his/her diagnostic coronavirus test and potentially longer if they're positive. I have also advised pt that if his/her COVID-19 test returns positive, it's recommended to self-isolate for at least 10 days after symptoms first appeared AND until fever-free for 24 hours without fever reducer AND other symptoms have improved or resolved. Discussed self-isolation recommendations as well as instructions for household member/close contacts as per the [facility] and [place] DHHS, and also gave patient the [facility] packet with this information.  Patient/parent/caregiver has been advised to return to the [facility] or PCP in 3-5 days if no better; to PCP or the Emergency Department if new signs and symptoms develop, or if the current signs or symptoms continue to change or worsen for further workup, evaluation and treatment as clinically indicated  and appropriate  The patient will follow up with their current PCP if and as advised. If the patient  does not currently have a PCP we will assist them in obtaining one.   The patient may need specialty follow up if the symptoms continue, in spite of conservative treatment and management, for further workup, evaluation, consultation and treatment as clinically indicated and appropriate.  Patient/parent/caregiver verbalized understanding and agreement of plan as discussed.  All questions were addressed during visit.  Please see discharge instructions below for further details of plan.  Discharge Instructions:   Discharge Instructions      Your strep test today is negative.  Streptococcal throat culture will be performed per our protocol.  The result of your throat culture will be posted to your MyChart once it is complete, this typically takes 3 to 5 days.    Based on my physical exam findings and the history you provided to me today, I recommend that you begin antibiotics now for presumed strep throat instead of waiting for the strep culture result.  I have sent a prescription to your pharmacy.  After 24 hours of antibiotics, you should begin to feel significantly better.     After 24 hours of taking antibiotics, please discard your toothbrush as well as any other oral devices that you are currently using and replace them with new ones to avoid reinfection.   If your streptococcal throat culture has a negative result but you feel significantly better after taking antibiotics for 24 to 48 hours, I strongly recommend that you finish the full 5-day course.  Bacterial culture tests are only as reliable as the laboratory technician performing them.  Alternately, if your streptococcal throat culture has a negative result and you see no improvement of your symptoms after 24 to 48 hours of antibiotics, please discontinue the antibiotics as they are no longer indicated.  Your throat infection will then be most likely considered viral and will have to resolve on its own.   Once you have been on antibiotics  for a full 24 hours, you are no longer considered contagious.   I have provided you with a note to return to work.  I also recommend that you take one more COVID-19 test just to be sure that this is not the cause of your sore throat.  Your physical exam findings are not consistent with COVID-19 but because your symptoms have been waxing and waning, which is common with COVID-19 infection, I think one more test will give you final confirmation that this is not COVID-19.  Please see the list below for recommended medications, dosages and frequencies to provide relief of your current symptoms:    Z-Pak (azithromycin):  take 2 tablets the first day, then take 1 tablet daily every day thereafter until complete.  This antibiotic kills the bacteria that typically affects her upper respiratory system.  I have sent a prescription to your pharmacy.  Medrol Dosepak (methylprednisolone): This is a steroid that will significantly calm your upper and lower airways, please take one row of tablets daily with your breakfast meal starting tomorrow morning until the prescription is complete.  I have sent a prescription to your pharmacy.  Derm otic (fluocinolone): This is an eardrop that you can use to soothe the irritation in your ear canal caused by psoriasis, eczema and allergies.   Advil, Motrin (ibuprofen): This is a good anti-inflammatory medication which addresses aches, pains and inflammation of the upper airways that causes sinus and nasal congestion  as well as in the lower airways which makes your cough feel tight and sometimes burn.  I recommend that you take between 400 to 600 mg every 6-8 hours as needed.      Tylenol (acetaminophen): This is a good fever reducer.  If your body temperature rises above 101.5 as measured with a thermometer, it is recommended that you take 1,000 mg every 8 hours until your temperature falls below 101.5, please not take more than 3,000 mg of acetaminophen either as a separate  medication or as in ingredient in an over-the-counter cold/flu preparation within a 24-hour period.      Please follow-up within the next 5-7 days either with your primary care provider or urgent care if your symptoms do not resolve.  If you do not have a primary care provider, we will assist you in finding one.   Thank you for visiting urgent care today.  We appreciate the opportunity to participate in your care.       This office note has been dictated using Museum/gallery curator.  Unfortunately, and despite my best efforts, this method of dictation can sometimes lead to occasional typographical or grammatical errors.  I apologize in advance if this occurs.     Lynden Oxford Scales, PA-C 10/15/21 1241

## 2021-10-15 NOTE — ED Triage Notes (Signed)
Patient presents to [facility] for evaluation of sore throat starting last week, had fever Wednesday through Friday, feltbetter, went to a wedding Sunday, began to have scratchy throat Monday morning, and has been worsening ever since.  She states she feels like she is having trouble swallowing, like she couldn't swallow gel pills the other day when she normally doesn't have any issue.

## 2021-10-15 NOTE — Discharge Instructions (Addendum)
Your strep test today is negative.  Streptococcal throat culture will be performed per our protocol.  The result of your throat culture will be posted to your MyChart once it is complete, this typically takes 3 to 5 days.    Based on my physical exam findings and the history you provided to me today, I recommend that you begin antibiotics now for presumed strep throat instead of waiting for the strep culture result.  I have sent a prescription to your pharmacy.  After 24 hours of antibiotics, you should begin to feel significantly better.     After 24 hours of taking antibiotics, please discard your toothbrush as well as any other oral devices that you are currently using and replace them with new ones to avoid reinfection.   If your streptococcal throat culture has a negative result but you feel significantly better after taking antibiotics for 24 to 48 hours, I strongly recommend that you finish the full 5-day course.  Bacterial culture tests are only as reliable as the laboratory technician performing them.  Alternately, if your streptococcal throat culture has a negative result and you see no improvement of your symptoms after 24 to 48 hours of antibiotics, please discontinue the antibiotics as they are no longer indicated.  Your throat infection will then be most likely considered viral and will have to resolve on its own.   Once you have been on antibiotics for a full 24 hours, you are no longer considered contagious.   I have provided you with a note to return to work.  I also recommend that you take one more COVID-19 test just to be sure that this is not the cause of your sore throat.  Your physical exam findings are not consistent with COVID-19 but because your symptoms have been waxing and waning, which is common with COVID-19 infection, I think one more test will give you final confirmation that this is not COVID-19.  Please see the list below for recommended medications, dosages and  frequencies to provide relief of your current symptoms:    Z-Pak (azithromycin):  take 2 tablets the first day, then take 1 tablet daily every day thereafter until complete.  This antibiotic kills the bacteria that typically affects her upper respiratory system.  I have sent a prescription to your pharmacy.  Medrol Dosepak (methylprednisolone): This is a steroid that will significantly calm your upper and lower airways, please take one row of tablets daily with your breakfast meal starting tomorrow morning until the prescription is complete.  I have sent a prescription to your pharmacy.  Derm otic (fluocinolone): This is an eardrop that you can use to soothe the irritation in your ear canal caused by psoriasis, eczema and allergies.   Advil, Motrin (ibuprofen): This is a good anti-inflammatory medication which addresses aches, pains and inflammation of the upper airways that causes sinus and nasal congestion as well as in the lower airways which makes your cough feel tight and sometimes burn.  I recommend that you take between 400 to 600 mg every 6-8 hours as needed.      Tylenol (acetaminophen): This is a good fever reducer.  If your body temperature rises above 101.5 as measured with a thermometer, it is recommended that you take 1,000 mg every 8 hours until your temperature falls below 101.5, please not take more than 3,000 mg of acetaminophen either as a separate medication or as in ingredient in an over-the-counter cold/flu preparation within a 24-hour period.  Please follow-up within the next 5-7 days either with your primary care provider or urgent care if your symptoms do not resolve.  If you do not have a primary care provider, we will assist you in finding one.   Thank you for visiting urgent care today.  We appreciate the opportunity to participate in your care.

## 2021-10-18 LAB — CULTURE, GROUP A STREP (THRC)

## 2022-04-27 ENCOUNTER — Emergency Department (HOSPITAL_BASED_OUTPATIENT_CLINIC_OR_DEPARTMENT_OTHER)
Admission: EM | Admit: 2022-04-27 | Discharge: 2022-04-27 | Disposition: A | Discharge status: Home / Self Care | Payer: BC Managed Care – PPO | Attending: Emergency Medicine | Admitting: Emergency Medicine

## 2022-04-27 ENCOUNTER — Encounter (HOSPITAL_BASED_OUTPATIENT_CLINIC_OR_DEPARTMENT_OTHER): Payer: Self-pay | Admitting: Emergency Medicine

## 2022-04-27 ENCOUNTER — Other Ambulatory Visit: Payer: Self-pay

## 2022-04-27 DIAGNOSIS — J069 Acute upper respiratory infection, unspecified: Secondary | ICD-10-CM | POA: Diagnosis not present

## 2022-04-27 DIAGNOSIS — Z1152 Encounter for screening for COVID-19: Secondary | ICD-10-CM | POA: Insufficient documentation

## 2022-04-27 DIAGNOSIS — R509 Fever, unspecified: Secondary | ICD-10-CM | POA: Diagnosis present

## 2022-04-27 LAB — RESP PANEL BY RT-PCR (RSV, FLU A&B, COVID)  RVPGX2
Influenza A by PCR: NEGATIVE
Influenza B by PCR: NEGATIVE
Resp Syncytial Virus by PCR: NEGATIVE
SARS Coronavirus 2 by RT PCR: NEGATIVE

## 2022-04-27 MED ORDER — ONDANSETRON 4 MG PO TBDP
8.0000 mg | ORAL_TABLET | Freq: Once | ORAL | Status: AC
  Administered 2022-04-27: 8 mg via ORAL
  Filled 2022-04-27: qty 2

## 2022-04-27 MED ORDER — IBUPROFEN 800 MG PO TABS
800.0000 mg | ORAL_TABLET | Freq: Once | ORAL | Status: AC
  Administered 2022-04-27: 800 mg via ORAL
  Filled 2022-04-27: qty 1

## 2022-04-27 NOTE — ED Triage Notes (Addendum)
Flu like syms, started mild on sat was worse Sunday when woke up today more symptoms.

## 2022-04-27 NOTE — ED Provider Notes (Signed)
[facility] Provider Note: Lowella Dell, MD, FACEP  CSN: [id number] MRN: [id number] ARRIVAL: 04/27/22 at 0220 ROOM: MH06/[facility]06   CHIEF COMPLAINT  URI   HISTORY OF PRESENT ILLNESS  04/27/22 3:40 AM Heidi Miles is a 26 y.o. female with 1 to 2 days of flulike symptoms.  Specifically she has had fever, body aches, chills, vomiting, mild diarrhea, sore throat and cough.  She last vomited on arrival to the ED.  She rates her body aches as a 10 out of 10.  She had a temperature of 101.5 on arrival and was given 800 mg of ibuprofen.  She had been taking Tylenol at home.   History reviewed. No pertinent past medical history.  Past Surgical History:  Procedure Laterality Date   TONSILLECTOMY      Family History  Problem Relation Age of Onset   Healthy Mother    Healthy Father     Social History   Tobacco Use   Smoking status: Never   Smokeless tobacco: Never  Vaping Use   Vaping Use: Never used  Substance Use Topics   Alcohol use: No   Drug use: Yes    Types: Marijuana    Prior to Admission medications   Medication Sig Start Date End Date Taking? Authorizing Provider  Fluocinolone Acetonide (DERMOTIC) 0.01 % OIL Place 5 drops in ear(s) 2 (two) times daily as needed (Itching in ears). 10/15/21   Theadora Rama Scales, PA-C  [place] 28 0.25-35 MG-MCG tablet Take 1 tablet by mouth daily. 07/31/18   [provider]    Allergies Patient has no known allergies.   REVIEW OF SYSTEMS  Negative except as noted here or in the History of Present Illness.   PHYSICAL EXAMINATION  Initial Vital Signs Blood pressure 133/81, pulse (!) 109, temperature (!) 101.5 F (38.6 C), temperature source Oral, resp. rate 20, height 5' 2.75" (1.594 m), weight 57.6 kg, last menstrual period 04/27/2022, SpO2 100 %.  Examination General: Well-developed, well-nourished female in no acute distress; appearance consistent with age of record HENT: normocephalic; atraumatic;  pharyngeal erythema without significant edema or exudate; uvula midline Eyes: Normal appearance Neck: supple Heart: regular rate and rhythm Lungs: clear to auscultation bilaterally Abdomen: soft; nondistended; nontender; bowel sounds present Extremities: No deformity; full range of motion Neurologic: Awake, alert and oriented; motor function intact in all extremities and symmetric; no facial droop Skin: Warm and dry Psychiatric: Normal mood and affect   RESULTS  Summary of this visit's results, reviewed and interpreted by myself:   EKG Interpretation  Date/Time:    Ventricular Rate:    PR Interval:    QRS Duration:   QT Interval:    QTC Calculation:   R Axis:     Text Interpretation:         Laboratory Studies: Results for orders placed or performed during the hospital encounter of 04/27/22 (from the past 24 hour(s))  Resp panel by RT-PCR (RSV, Flu A&B, Covid) Anterior Nasal Swab     Status: None   Collection Time: 04/27/22  2:32 AM   Specimen: Anterior Nasal Swab  Result Value Ref Range   SARS Coronavirus 2 by RT PCR NEGATIVE NEGATIVE   Influenza A by PCR NEGATIVE NEGATIVE   Influenza B by PCR NEGATIVE NEGATIVE   Resp Syncytial Virus by PCR NEGATIVE NEGATIVE   Imaging Studies: No results found.  ED COURSE and MDM  Nursing notes, initial and subsequent vitals signs, including pulse oximetry, reviewed and interpreted by myself.  Vitals:   04/27/22 0229 04/27/22 0231 04/27/22 0348  BP:  133/81   Pulse:  (!) 109   Resp:  20   Temp:  (!) 101.5 F (38.6 C) 99.1 F (37.3 C)  TempSrc:  Oral Oral  SpO2:  100%   Weight: 57.6 kg    Height: 5' 2.75" (1.594 m)     Medications  ondansetron (ZOFRAN-ODT) disintegrating tablet 8 mg (has no administration in time range)  ibuprofen (ADVIL) tablet 800 mg (800 mg Oral Given 04/27/22 0248)   Presentation is consistent with a viral illness but she is negative for tested viruses.  We will provide Zofran for nausea and  vomiting.  She was advised to take Mucinex DM for her cough.   PROCEDURES  Procedures   ED DIAGNOSES     ICD-10-CM   1. Viral URI with cough  J06.9          Karmine Kauer, MD 04/27/22 [phone]

## 2023-02-23 ENCOUNTER — Ambulatory Visit
Admission: EM | Admit: 2023-02-23 | Discharge: 2023-02-23 | Disposition: A | Discharge status: Home / Self Care | Payer: BC Managed Care – PPO | Attending: Internal Medicine | Admitting: Internal Medicine

## 2023-02-23 DIAGNOSIS — Z1152 Encounter for screening for COVID-19: Secondary | ICD-10-CM | POA: Diagnosis not present

## 2023-02-23 DIAGNOSIS — B349 Viral infection, unspecified: Secondary | ICD-10-CM

## 2023-02-23 DIAGNOSIS — R051 Acute cough: Secondary | ICD-10-CM

## 2023-02-23 DIAGNOSIS — R062 Wheezing: Secondary | ICD-10-CM | POA: Diagnosis not present

## 2023-02-23 DIAGNOSIS — R059 Cough, unspecified: Secondary | ICD-10-CM | POA: Diagnosis present

## 2023-02-23 DIAGNOSIS — J029 Acute pharyngitis, unspecified: Secondary | ICD-10-CM

## 2023-02-23 LAB — POCT INFLUENZA A/B
Influenza A, POC: NEGATIVE
Influenza B, POC: NEGATIVE

## 2023-02-23 LAB — POCT RAPID STREP A (OFFICE): Rapid Strep A Screen: NEGATIVE

## 2023-02-23 MED ORDER — ALBUTEROL SULFATE HFA 108 (90 BASE) MCG/ACT IN AERS: 1.0000 | INHALATION_SPRAY | Freq: Four times a day (QID) | RESPIRATORY_TRACT | 0 refills | Status: AC | PRN

## 2023-02-23 MED ORDER — BENZONATATE 200 MG PO CAPS: 200.0000 mg | ORAL_CAPSULE | Freq: Three times a day (TID) | ORAL | 0 refills | Status: DC | PRN

## 2023-02-23 MED ORDER — IPRATROPIUM-ALBUTEROL 0.5-2.5 (3) MG/3ML IN SOLN
3.0000 mL | Freq: Once | RESPIRATORY_TRACT | Status: AC
  Administered 2023-02-23: 3 mL via RESPIRATORY_TRACT

## 2023-02-23 MED ORDER — IBUPROFEN 600 MG PO TABS: 600.0000 mg | ORAL_TABLET | Freq: Four times a day (QID) | ORAL | 0 refills | Status: AC | PRN

## 2023-02-23 NOTE — ED Triage Notes (Signed)
Pt presents with congestion, fatigue, headache, sinus pressure, cough, sore throat, temperature 101, body aches and chills, that started Monday. Taking tylenol and mucinex.

## 2023-02-23 NOTE — Discharge Instructions (Addendum)
The clinic will contact you with results of the COVID test done today if positive.  May use the albuterol inhaler as needed for wheezing or shortness of breath.  Ibuprofen as needed for fever or bodyaches.  Tessalon as needed for cough.  Lots of rest and fluids.  Please follow-up with your PCP if your symptoms do not improve.  Please go to the ER for any worsening symptoms.  I hope you feel better soon!

## 2023-02-23 NOTE — ED Provider Notes (Signed)
[facility]    CSN: [id number] Arrival date & time: 02/23/23  1251      History   Chief Complaint Chief Complaint  Patient presents with   Cough   Headache    HPI Heidi Miles is a 27 y.o. female  presents for evaluation of URI symptoms for 2 days. Patient reports associated symptoms of cough, congestion, fever 101, sore throat, body aches. Denies N/V/D, ear pain, shortness of breath. Patient does not have a hx of asthma. Patient does not have a history of smoking.    Pt has taken Tylenol and Mucinex OTC for symptoms. Pt has no other concerns at this time.    Cough Associated symptoms: fever, headaches, myalgias and sore throat   Headache Associated symptoms: congestion, cough, fever, myalgias and sore throat     History reviewed. No pertinent past medical history.  Patient Active Problem List   Diagnosis Date Noted   WRIST PAIN, LEFT 02/05/2010    Past Surgical History:  Procedure Laterality Date   TONSILLECTOMY      OB History   No obstetric history on file.      Home Medications    Prior to Admission medications   Medication Sig Start Date End Date Taking? Authorizing Provider  albuterol (VENTOLIN HFA) 108 (90 Base) MCG/ACT inhaler Inhale 1-2 puffs into the lungs every 6 (six) hours as needed for wheezing or shortness of breath. 02/23/23  Yes Radford Pax, NP  benzonatate (TESSALON) 200 MG capsule Take 1 capsule (200 mg total) by mouth 3 (three) times daily as needed. 02/23/23  Yes Radford Pax, NP  HAILEY 24 FE 1-20 MG-MCG(24) tablet Take 1 tablet by mouth daily.   Yes [provider]  ibuprofen (ADVIL) 600 MG tablet Take 1 tablet (600 mg total) by mouth every 6 (six) hours as needed. 02/23/23  Yes Radford Pax, NP  Fluocinolone Acetonide (DERMOTIC) 0.01 % OIL Place 5 drops in ear(s) 2 (two) times daily as needed (Itching in ears). 10/15/21   Theadora Rama Scales, PA-C  SKYRIZI PEN 150 MG/ML pen  03/03/21   [provider]   SPRINTEC 28 0.25-35 MG-MCG tablet Take 1 tablet by mouth daily. 07/31/18   [provider]    Family History Family History  Problem Relation Age of Onset   Healthy Mother    Healthy Father     Social History Social History   Tobacco Use   Smoking status: Never   Smokeless tobacco: Never  Vaping Use   Vaping status: Never Used  Substance Use Topics   Alcohol use: No   Drug use: Yes    Types: Marijuana     Allergies   Patient has no known allergies.   Review of Systems Review of Systems  Constitutional:  Positive for fever.  HENT:  Positive for congestion and sore throat.   Respiratory:  Positive for cough.   Musculoskeletal:  Positive for myalgias.  Neurological:  Positive for headaches.     Physical Exam Triage Vital Signs ED Triage Vitals  Encounter Vitals Group     BP 02/23/23 1330 128/75     Systolic BP Percentile --      Diastolic BP Percentile --      Pulse Rate 02/23/23 1330 89     Resp 02/23/23 1330 15     Temp 02/23/23 1330 98.8 F (37.1 C)     Temp Source 02/23/23 1330 Oral     SpO2 02/23/23 1330 97 %  Weight --      Height --      Head Circumference --      Peak Flow --      Pain Score 02/23/23 1332 9     Pain Loc --      Pain Education --      Exclude from Growth Chart --    No data found.  Updated Vital Signs BP 128/75 (BP Location: Left Arm)   Pulse 89   Temp 98.8 F (37.1 C) (Oral)   Resp 15   LMP 01/27/2023 (Exact Date)   SpO2 97%   Visual Acuity Right Eye Distance:   Left Eye Distance:   Bilateral Distance:    Right Eye Near:   Left Eye Near:    Bilateral Near:     Physical Exam Vitals and nursing note reviewed.  Constitutional:      General: She is not in acute distress.    Appearance: She is well-developed. She is not ill-appearing.  HENT:     Head: Normocephalic and atraumatic.     Right Ear: Tympanic membrane and ear canal normal.     Left Ear: Tympanic membrane and ear canal normal.     Nose:  Congestion present.     Mouth/Throat:     Mouth: Mucous membranes are moist.     Pharynx: Oropharynx is clear. Uvula midline. Posterior oropharyngeal erythema present.     Tonsils: No tonsillar exudate or tonsillar abscesses.  Eyes:     Conjunctiva/sclera: Conjunctivae normal.     Pupils: Pupils are equal, round, and reactive to light.  Cardiovascular:     Rate and Rhythm: Normal rate and regular rhythm.     Heart sounds: Normal heart sounds.  Pulmonary:     Effort: Pulmonary effort is normal.     Breath sounds: Wheezing present.     Comments: Expiratory wheezing in all lung fields Musculoskeletal:     Cervical back: Normal range of motion and neck supple.  Lymphadenopathy:     Cervical: No cervical adenopathy.  Skin:    General: Skin is warm and dry.  Neurological:     General: No focal deficit present.     Mental Status: She is alert and oriented to person, place, and time.  Psychiatric:        Mood and Affect: Mood normal.        Behavior: Behavior normal.      UC Treatments / Results  Labs (all labs ordered are listed, but only abnormal results are displayed) Labs Reviewed  SARS CORONAVIRUS 2 (TAT 6-24 HRS)  POCT RAPID STREP A (OFFICE)  POCT INFLUENZA A/B    EKG   Radiology No results found.  Procedures Procedures (including critical care time)  Medications Ordered in UC Medications  ipratropium-albuterol (DUONEB) 0.5-2.5 (3) MG/3ML nebulizer solution 3 mL (3 mLs Nebulization Given 02/23/23 1355)    Initial Impression / Assessment and Plan / UC Course  I have reviewed the triage vital signs and the nursing notes.  Pertinent labs & imaging results that were available during my care of the patient were reviewed by me and considered in my medical decision making (see chart for details).     Reviewed exam and sx with patient. No red flags. Neg rapid flu and strep. Covid pcr and will contact if positive.  Discussed viral illness and symptomatic treatment.   Wheezing resolved after nebulizer.  Albuterol inhaler as needed for wheezing.  Tessalon as needed for cough.  Ibuprofen as  needed for fever/body aches.  PCP follow-up if symptoms do not improve.  ER precautions reviewed. Final Clinical Impressions(s) / UC Diagnoses   Final diagnoses:  Acute cough  Sore throat  Wheezing  Viral illness     Discharge Instructions      My clinical contact you with results of the COVID test done today if positive.  May use the albuterol inhaler as needed for wheezing or shortness of breath.  Ibuprofen as needed for fever or bodyaches.  Tessalon as needed for cough.  Lots of rest and fluids.  Please follow-up with your PCP if your symptoms do not improve.  Please go to the ER for any worsening symptoms.  I hope you feel better soon!     ED Prescriptions     Medication Sig Dispense Auth. Provider   albuterol (VENTOLIN HFA) 108 (90 Base) MCG/ACT inhaler Inhale 1-2 puffs into the lungs every 6 (six) hours as needed for wheezing or shortness of breath. 1 each Radford Pax, NP   benzonatate (TESSALON) 200 MG capsule Take 1 capsule (200 mg total) by mouth 3 (three) times daily as needed. 20 capsule Radford Pax, NP   ibuprofen (ADVIL) 600 MG tablet Take 1 tablet (600 mg total) by mouth every 6 (six) hours as needed. 20 tablet Radford Pax, NP      PDMP not reviewed this encounter.   Radford Pax, NP 02/23/23 1416

## 2023-02-24 LAB — SARS CORONAVIRUS 2 (TAT 6-24 HRS): SARS Coronavirus 2: NEGATIVE

## 2024-02-25 ENCOUNTER — Other Ambulatory Visit: Payer: Self-pay

## 2024-02-25 ENCOUNTER — Encounter (HOSPITAL_BASED_OUTPATIENT_CLINIC_OR_DEPARTMENT_OTHER): Payer: Self-pay | Admitting: Emergency Medicine

## 2024-02-25 ENCOUNTER — Emergency Department (HOSPITAL_BASED_OUTPATIENT_CLINIC_OR_DEPARTMENT_OTHER)
Admission: EM | Admit: 2024-02-25 | Discharge: 2024-02-25 | Disposition: A | Discharge status: Home / Self Care | Attending: Emergency Medicine | Admitting: Emergency Medicine

## 2024-02-25 DIAGNOSIS — R1084 Generalized abdominal pain: Secondary | ICD-10-CM | POA: Insufficient documentation

## 2024-02-25 DIAGNOSIS — Y909 Presence of alcohol in blood, level not specified: Secondary | ICD-10-CM | POA: Insufficient documentation

## 2024-02-25 DIAGNOSIS — F10920 Alcohol use, unspecified with intoxication, uncomplicated: Secondary | ICD-10-CM

## 2024-02-25 DIAGNOSIS — R1110 Vomiting, unspecified: Secondary | ICD-10-CM | POA: Insufficient documentation

## 2024-02-25 DIAGNOSIS — F10120 Alcohol abuse with intoxication, uncomplicated: Secondary | ICD-10-CM | POA: Diagnosis present

## 2024-02-25 DIAGNOSIS — R197 Diarrhea, unspecified: Secondary | ICD-10-CM | POA: Insufficient documentation

## 2024-02-25 LAB — CBC WITH DIFFERENTIAL/PLATELET
Abs Immature Granulocytes: 0.01 K/uL (ref 0.00–0.07)
Basophils Absolute: 0.1 K/uL (ref 0.0–0.1)
Basophils Relative: 1 %
Eosinophils Absolute: 0 K/uL (ref 0.0–0.5)
Eosinophils Relative: 0 %
HCT: 38.6 % (ref 36.0–46.0)
Hemoglobin: 13.4 g/dL (ref 12.0–15.0)
Immature Granulocytes: 0 %
Lymphocytes Relative: 22 %
Lymphs Abs: 1.3 K/uL (ref 0.7–4.0)
MCH: 31.3 pg (ref 26.0–34.0)
MCHC: 34.7 g/dL (ref 30.0–36.0)
MCV: 90.2 fL (ref 80.0–100.0)
Monocytes Absolute: 0.2 K/uL (ref 0.1–1.0)
Monocytes Relative: 3 %
Neutro Abs: 4.1 K/uL (ref 1.7–7.7)
Neutrophils Relative %: 74 %
Platelets: 313 K/uL (ref 150–400)
RBC: 4.28 MIL/uL (ref 3.87–5.11)
RDW: 13 % (ref 11.5–15.5)
WBC: 5.6 K/uL (ref 4.0–10.5)
nRBC: 0 % (ref 0.0–0.2)

## 2024-02-25 LAB — COMPREHENSIVE METABOLIC PANEL WITH GFR
ALT: 20 U/L (ref 0–44)
AST: 25 U/L (ref 15–41)
Albumin: 5 g/dL (ref 3.5–5.0)
Alkaline Phosphatase: 56 U/L (ref 38–126)
Anion gap: 12 (ref 5–15)
BUN: 9 mg/dL (ref 6–20)
CO2: 26 mmol/L (ref 22–32)
Calcium: 9.3 mg/dL (ref 8.9–10.3)
Chloride: 102 mmol/L (ref 98–111)
Creatinine, Ser: 0.72 mg/dL (ref 0.44–1.00)
GFR, Estimated: >60 mL/min (ref >60)
Glucose, Bld: 114 mg/dL — ABNORMAL HIGH (ref 70–99)
Potassium: 3.9 mmol/L (ref 3.5–5.1)
Sodium: 140 mmol/L (ref 135–145)
Total Bilirubin: 0.3 mg/dL (ref 0.0–1.2)
Total Protein: 7.9 g/dL (ref 6.5–8.1)

## 2024-02-25 LAB — HCG, SERUM, QUALITATIVE: Preg, Serum: NEGATIVE

## 2024-02-25 LAB — LIPASE, BLOOD: Lipase: 33 U/L (ref 11–51)

## 2024-02-25 MED ORDER — ONDANSETRON HCL 4 MG/2ML IJ SOLN
4.0000 mg | Freq: Once | INTRAMUSCULAR | Status: AC
  Administered 2024-02-25: 4 mg via INTRAVENOUS
  Filled 2024-02-25: qty 2

## 2024-02-25 MED ORDER — ONDANSETRON 4 MG PO TBDP: 4.0000 mg | ORAL_TABLET | Freq: Three times a day (TID) | ORAL | 0 refills | Status: AC | PRN

## 2024-02-25 MED ORDER — SODIUM CHLORIDE 0.9 % IV BOLUS
1000.0000 mL | Freq: Once | INTRAVENOUS | Status: AC
  Administered 2024-02-25: 1000 mL via INTRAVENOUS

## 2024-02-25 NOTE — ED Provider Notes (Signed)
 [place] [facility] Provider Note   CSN: [id number] Arrival date & time: 02/25/24  0543     Patient presents with: Alcohol Intoxication   Heidi Miles is a 28 y.o. female.   The history is provided by the patient.  Alcohol Intoxication  Heidi Miles is a 28 y.o. female who presents to the Emergency Department complaining of vomiting. She presents the emergency department for evaluation of numerous episodes of emesis and diarrhea since two in the morning. She states that is due to drinking too much last night. She does report some mild abdominal discomfort. No fever but feels cold. No dysuria. She has a history of psoriasis, no additional medical problems. Denies any chance that she is pregnant.     Prior to Admission medications   Medication Sig Start Date End Date Taking? Authorizing Provider  albuterol  (VENTOLIN  HFA) 108 (90 Base) MCG/ACT inhaler Inhale 1-2 puffs into the lungs every 6 (six) hours as needed for wheezing or shortness of breath. 02/23/23   Mayer, Jodi R, NP  benzonatate  (TESSALON ) 200 MG capsule Take 1 capsule (200 mg total) by mouth 3 (three) times daily as needed. 02/23/23   Mayer, Jodi R, NP  Fluocinolone  Acetonide (DERMOTIC ) 0.01 % OIL Place 5 drops in ear(s) 2 (two) times daily as needed (Itching in ears). 10/15/21   Joesph Shaver Scales, PA-C  HAILEY 24 FE 1-20 MG-MCG(24) tablet Take 1 tablet by mouth daily.    [provider]  ibuprofen  (ADVIL ) 600 MG tablet Take 1 tablet (600 mg total) by mouth every 6 (six) hours as needed. 02/23/23   Mayer, Jodi R, NP  SKYRIZI PEN 150 MG/ML pen  03/03/21   [provider]  SPRINTEC 28 0.25-35 MG-MCG tablet Take 1 tablet by mouth daily. 07/31/18   [provider]    Allergies: Patient has no known allergies.    Review of Systems  All other systems reviewed and are negative.   Updated Vital Signs BP 122/77 (BP Location: Right Arm)   Pulse 67   Temp 98.7  F (37.1 C) (Oral)   Resp 20   Ht 5' 2.75 (1.594 m)   Wt 61.2 kg   LMP 02/14/2024 (Approximate)   SpO2 100%   BMI 24.11 kg/m   Physical Exam Vitals and nursing note reviewed.  Constitutional:      Appearance: She is well-developed.  HENT:     Head: Normocephalic and atraumatic.  Cardiovascular:     Rate and Rhythm: Normal rate and regular rhythm.  Pulmonary:     Effort: Pulmonary effort is normal. No respiratory distress.  Abdominal:     Palpations: Abdomen is soft.     Tenderness: There is no guarding or rebound.     Comments: Mild generalized abdominal tenderness  Musculoskeletal:        General: No tenderness.  Skin:    General: Skin is warm and dry.  Neurological:     Mental Status: She is alert and oriented to person, place, and time.  Psychiatric:        Behavior: Behavior normal.     (all labs ordered are listed, but only abnormal results are displayed) Labs Reviewed - No data to display  EKG: None  Radiology: No results found.   Procedures   Medications Ordered in the ED - No data to display  Medical Decision Making Amount and/or Complexity of Data Reviewed Labs: ordered.  Risk Prescription drug management.   Patient here for evaluation of vomiting and diarrhea in the setting of drinking excess alcohol. She has minimal tenderness on examination without peritoneal findings. Labs are unremarkable. She was treated with IV fluids and antiemetics. Patient care transferred pending oral trial and reassessment.     Final diagnoses:  None    ED Discharge Orders     None          Griselda Norris, MD 02/25/24 [phone]

## 2024-02-25 NOTE — ED Notes (Signed)
 Pt provided water for PO challenge

## 2024-02-25 NOTE — ED Notes (Addendum)
 Pt alert and oriented X 4 at the time of discharge. RR even and unlabored. No acute distress noted. Pt verbalized understanding of discharge instructions as discussed. Pt transported in wheelchair to lobby at time of discharge.

## 2024-02-25 NOTE — ED Triage Notes (Addendum)
 Pt states was at [facility] and drank too much, states, threw up the steak, the burger and the shrimp. States need some IV fluid.

## 2024-02-25 NOTE — ED Provider Notes (Signed)
 Clinical Course as of 02/25/24 0840  Sat Feb 25, 2024  9160 Received signout disposition pending reevaluation and p.o. tolerance. [TY]  [id number] Lab work reviewed, no significant abnormalities.  Patient was reevaluated, still somewhat intoxicated.  However, tolerating p.o. currently.  She is with her mother bedside who feels comfortable taking patient home and monitoring her there.  Will discharge with return precautions. [TY]    Clinical Course User Index [TY] Neysa Caron PARAS, DO      Neysa Caron PARAS, [place] 02/25/24 [phone]

## 2024-02-25 NOTE — ED Notes (Signed)
 Pt provided with ginger ale for PO challenge per provider's orders. Will monitor for tolerance

## 2024-02-25 NOTE — Discharge Instructions (Signed)
 Please return if severe headache, vision loss, seizures, lethargy, inability eat or drink due to nausea vomiting or any new or worsening symptoms that are concerning to you.

## 2024-06-12 ENCOUNTER — Emergency Department (HOSPITAL_BASED_OUTPATIENT_CLINIC_OR_DEPARTMENT_OTHER)
Admission: EM | Admit: 2024-06-12 | Discharge: 2024-06-12 | Disposition: A | Discharge status: Home / Self Care | Attending: Emergency Medicine | Admitting: Emergency Medicine

## 2024-06-12 ENCOUNTER — Encounter (HOSPITAL_BASED_OUTPATIENT_CLINIC_OR_DEPARTMENT_OTHER): Payer: Self-pay

## 2024-06-12 ENCOUNTER — Other Ambulatory Visit: Payer: Self-pay

## 2024-06-12 DIAGNOSIS — U071 COVID-19: Secondary | ICD-10-CM | POA: Diagnosis not present

## 2024-06-12 DIAGNOSIS — R509 Fever, unspecified: Secondary | ICD-10-CM | POA: Diagnosis present

## 2024-06-12 LAB — RESP PANEL BY RT-PCR (RSV, FLU A&B, COVID)  RVPGX2
Influenza A by PCR: NEGATIVE
Influenza B by PCR: NEGATIVE
Resp Syncytial Virus by PCR: NEGATIVE
SARS Coronavirus 2 by RT PCR: POSITIVE — AB

## 2024-06-12 MED ORDER — BENZONATATE 100 MG PO CAPS: 100.0000 mg | ORAL_CAPSULE | Freq: Three times a day (TID) | ORAL | 0 refills | Status: AC

## 2024-06-12 NOTE — Discharge Instructions (Signed)
 Take Tylenol  or ibuprofen  every 6 hours as needed for fevers.  May take Tessalon  3 times a day as needed for cough.  Stay hydrated and drink plenty of fluids.  Get plenty of rest.  Follow-up with primary care doctor in 1 week if no improvement.  Return to ED if any symptoms worsen including uncontrollable fevers, chest pain, difficulty breathing.

## 2024-06-12 NOTE — ED Triage Notes (Signed)
 Generalized body aches, chills, and overall not feeling well.   Hopes she isn't getting the flu.

## 2024-06-12 NOTE — ED Provider Notes (Signed)
 " [place] [facility] Provider Note   CSN: [id number] Arrival date & time: 06/12/24  2058     Patient presents with: Generalized Body Aches   Heidi Miles is a 29 y.o. female.  Patient is a 29 year old female with no significant history who presents to the ED for subjective fever/chills, body aches, weakness, and cough for the past 4 days.  Notes she works at [employer] and could be around others that have been sick.  Has been taking over-the-counter medications with minimal relief.  She denies headache, dizziness, chest pain, shortness of breath, nausea/vomiting/diarrhea.  No further complaints.   HPI     Prior to Admission medications  Medication Sig Start Date End Date Taking? Authorizing Provider  benzonatate  (TESSALON ) 100 MG capsule Take 1 capsule (100 mg total) by mouth every 8 (eight) hours. 06/12/24  Yes Rania Prothero, Thersia RAMAN, PA-C  albuterol  (VENTOLIN  HFA) 108 (90 Base) MCG/ACT inhaler Inhale 1-2 puffs into the lungs every 6 (six) hours as needed for wheezing or shortness of breath. 02/23/23   Mayer, Jodi R, NP  Fluocinolone  Acetonide (DERMOTIC ) 0.01 % OIL Place 5 drops in ear(s) 2 (two) times daily as needed (Itching in ears). 10/15/21   Joesph Shaver Scales, PA-C  HAILEY 24 FE 1-20 MG-MCG(24) tablet Take 1 tablet by mouth daily.    [provider]  ibuprofen  (ADVIL ) 600 MG tablet Take 1 tablet (600 mg total) by mouth every 6 (six) hours as needed. 02/23/23   Mayer, Jodi R, NP  ondansetron  (ZOFRAN -ODT) 4 MG disintegrating tablet Take 1 tablet (4 mg total) by mouth every 8 (eight) hours as needed for nausea or vomiting. 02/25/24   Neysa Caron PARAS, DO  SKYRIZI PEN 150 MG/ML pen  03/03/21   [provider]  SPRINTEC 28 0.25-35 MG-MCG tablet Take 1 tablet by mouth daily. 07/31/18   [provider]    Allergies: Patient has no known allergies.    Review of Systems  Constitutional:  Positive for chills and fever.  HENT:   Positive for congestion and sore throat. Negative for ear pain.   Respiratory:  Positive for cough. Negative for shortness of breath.   Cardiovascular:  Negative for chest pain.  Neurological:  Negative for dizziness and headaches.  All other systems reviewed and are negative.   Updated Vital Signs BP (!) 133/96 (BP Location: Right Arm)   Pulse 100   Temp 99.3 F (37.4 C) (Oral)   Resp 17   Ht 5' 2.75 (1.594 m)   Wt 59 kg   SpO2 100%   BMI 23.21 kg/m   Physical Exam Constitutional:      Appearance: Normal appearance.  HENT:     Head: Normocephalic and atraumatic.     Nose: Rhinorrhea present.     Mouth/Throat:     Mouth: Mucous membranes are moist.     Pharynx: Oropharynx is clear.  Cardiovascular:     Rate and Rhythm: Normal rate.  Pulmonary:     Effort: Pulmonary effort is normal.     Breath sounds: Normal breath sounds.  Skin:    General: Skin is warm and dry.  Neurological:     Mental Status: She is alert and oriented to person, place, and time.  Psychiatric:        Mood and Affect: Mood normal.        Behavior: Behavior normal.     (all labs ordered are listed, but only abnormal results are displayed) Labs Reviewed  RESP PANEL BY RT-PCR (RSV, FLU A&B, COVID)  [id number] - Abnormal; Notable for the following components:      Result Value   SARS Coronavirus 2 by RT PCR POSITIVE (*)    All other components within normal limits    EKG: None  Radiology: No results found.    Medications Ordered in the ED - No data to display                                 Medical Decision Making Patient is a 29 year old female with no significant medical history who presents to the ED for flulike symptoms for the past 4 days.  Please see detailed HPI above.  On exam patient is alert and well-appearing.  Physical exam as noted above.  Lungs clear to auscultation and vital signs stable.  Differential includes acute viral illness, bronchitis, pneumonia, meningitis.  Less  concerns for meningitis as patient is afebrile with no meningismus.  Swab is positive for COVID today.  Suspect this is most likely cause of patient's symptoms.  She is otherwise well-appearing and tolerating oral intake.  Stable for discharge home.  Symptomatic care discussed and patient is agreeable.  Prescribed Tessalon  for cough.  Advised PCP follow-up in 1 week if no improvement.  Return precautions provided.  Risk Prescription drug management.       Final diagnoses:  COVID    ED Discharge Orders          Ordered    benzonatate  (TESSALON ) 100 MG capsule  Every 8 hours        06/12/24 2208               Neysa Thersia RAMAN, PA-C 06/12/24 2210    Lenor Hollering, MD 06/12/24 2304  "
